# Patient Record
Sex: Female | Born: 1955 | Race: White | Hispanic: No | Smoking: Never smoker
Health system: Southern US, Community
[De-identification: ages and names within clinical notes are randomized; demographics above are authoritative.]

## PROBLEM LIST (undated history)

## (undated) DIAGNOSIS — I10 Essential (primary) hypertension: Secondary | ICD-10-CM

## (undated) DIAGNOSIS — R519 Headache, unspecified: Secondary | ICD-10-CM

## (undated) DIAGNOSIS — F419 Anxiety disorder, unspecified: Secondary | ICD-10-CM

## (undated) DIAGNOSIS — K449 Diaphragmatic hernia without obstruction or gangrene: Secondary | ICD-10-CM

## (undated) DIAGNOSIS — R51 Headache: Secondary | ICD-10-CM

## (undated) DIAGNOSIS — G8929 Other chronic pain: Secondary | ICD-10-CM

## (undated) DIAGNOSIS — F988 Other specified behavioral and emotional disorders with onset usually occurring in childhood and adolescence: Secondary | ICD-10-CM

## (undated) DIAGNOSIS — E785 Hyperlipidemia, unspecified: Secondary | ICD-10-CM

## (undated) HISTORY — PX: APPENDECTOMY: SHX54

## (undated) HISTORY — DX: Diaphragmatic hernia without obstruction or gangrene: K44.9

## (undated) HISTORY — PX: ABDOMINAL HYSTERECTOMY: SHX81

## (undated) HISTORY — DX: Essential (primary) hypertension: I10

## (undated) HISTORY — PX: TUBAL LIGATION: SHX77

## (undated) HISTORY — PX: OTHER SURGICAL HISTORY: SHX169

## (undated) HISTORY — DX: Headache: R51

## (undated) HISTORY — PX: CHOLECYSTECTOMY: SHX55

## (undated) HISTORY — DX: Hyperlipidemia, unspecified: E78.5

## (undated) HISTORY — DX: Other chronic pain: G89.29

## (undated) HISTORY — DX: Other specified behavioral and emotional disorders with onset usually occurring in childhood and adolescence: F98.8

## (undated) HISTORY — DX: Anxiety disorder, unspecified: F41.9

## (undated) HISTORY — DX: Headache, unspecified: R51.9

---

## 1997-10-15 ENCOUNTER — Ambulatory Visit (HOSPITAL_COMMUNITY): Admission: RE | Admit: 1997-10-15 | Discharge: 1997-10-15 | Payer: Self-pay | Admitting: Obstetrics and Gynecology

## 1998-03-16 ENCOUNTER — Encounter: Payer: Self-pay | Admitting: Obstetrics and Gynecology

## 1998-03-21 ENCOUNTER — Inpatient Hospital Stay (HOSPITAL_COMMUNITY): Admission: RE | Admit: 1998-03-21 | Discharge: 1998-03-24 | Payer: Self-pay | Admitting: Obstetrics and Gynecology

## 1999-09-05 ENCOUNTER — Emergency Department (HOSPITAL_COMMUNITY): Admission: EM | Admit: 1999-09-05 | Discharge: 1999-09-05 | Payer: Self-pay | Admitting: Emergency Medicine

## 1999-11-16 ENCOUNTER — Ambulatory Visit (HOSPITAL_COMMUNITY): Admission: RE | Admit: 1999-11-16 | Discharge: 1999-11-16 | Payer: Self-pay | Admitting: Obstetrics and Gynecology

## 1999-11-16 ENCOUNTER — Encounter: Payer: Self-pay | Admitting: Obstetrics and Gynecology

## 1999-11-24 ENCOUNTER — Encounter: Admission: RE | Admit: 1999-11-24 | Discharge: 1999-11-24 | Payer: Self-pay | Admitting: Family Medicine

## 1999-11-24 ENCOUNTER — Encounter: Payer: Self-pay | Admitting: Family Medicine

## 2001-03-04 ENCOUNTER — Ambulatory Visit (HOSPITAL_COMMUNITY): Admission: RE | Admit: 2001-03-04 | Discharge: 2001-03-04 | Payer: Self-pay | Admitting: Gastroenterology

## 2001-12-19 ENCOUNTER — Encounter: Payer: Self-pay | Admitting: Family Medicine

## 2001-12-19 ENCOUNTER — Encounter: Admission: RE | Admit: 2001-12-19 | Discharge: 2001-12-19 | Payer: Self-pay | Admitting: Family Medicine

## 2003-07-16 ENCOUNTER — Ambulatory Visit (HOSPITAL_COMMUNITY): Admission: RE | Admit: 2003-07-16 | Discharge: 2003-07-16 | Payer: Self-pay | Admitting: Cardiovascular Disease

## 2003-11-13 ENCOUNTER — Emergency Department (HOSPITAL_COMMUNITY): Admission: EM | Admit: 2003-11-13 | Discharge: 2003-11-13 | Payer: Self-pay | Admitting: Emergency Medicine

## 2003-11-30 ENCOUNTER — Ambulatory Visit (HOSPITAL_COMMUNITY): Admission: RE | Admit: 2003-11-30 | Discharge: 2003-11-30 | Payer: Self-pay | Admitting: Gastroenterology

## 2005-01-23 ENCOUNTER — Other Ambulatory Visit: Admission: RE | Admit: 2005-01-23 | Discharge: 2005-01-23 | Payer: Self-pay | Admitting: *Deleted

## 2005-02-16 ENCOUNTER — Encounter: Admission: RE | Admit: 2005-02-16 | Discharge: 2005-02-16 | Payer: Self-pay | Admitting: Family Medicine

## 2005-02-28 ENCOUNTER — Encounter: Admission: RE | Admit: 2005-02-28 | Discharge: 2005-02-28 | Payer: Self-pay | Admitting: Family Medicine

## 2006-02-18 ENCOUNTER — Encounter: Admission: RE | Admit: 2006-02-18 | Discharge: 2006-02-18 | Payer: Self-pay | Admitting: Family Medicine

## 2006-08-19 ENCOUNTER — Ambulatory Visit (HOSPITAL_COMMUNITY): Admission: RE | Admit: 2006-08-19 | Discharge: 2006-08-19 | Payer: Self-pay | Admitting: Gastroenterology

## 2006-08-20 ENCOUNTER — Encounter (INDEPENDENT_AMBULATORY_CARE_PROVIDER_SITE_OTHER): Payer: Self-pay | Admitting: *Deleted

## 2007-01-01 ENCOUNTER — Emergency Department (HOSPITAL_COMMUNITY): Admission: EM | Admit: 2007-01-01 | Discharge: 2007-01-01 | Payer: Self-pay | Admitting: Emergency Medicine

## 2007-01-16 ENCOUNTER — Observation Stay (HOSPITAL_COMMUNITY): Admission: AD | Admit: 2007-01-16 | Discharge: 2007-01-17 | Payer: Self-pay | Admitting: Cardiology

## 2008-10-12 ENCOUNTER — Inpatient Hospital Stay (HOSPITAL_COMMUNITY): Admission: EM | Admit: 2008-10-12 | Discharge: 2008-10-15 | Payer: Self-pay | Admitting: Emergency Medicine

## 2008-10-12 ENCOUNTER — Ambulatory Visit: Payer: Self-pay | Admitting: Internal Medicine

## 2008-10-13 ENCOUNTER — Ambulatory Visit: Payer: Self-pay | Admitting: Vascular Surgery

## 2008-10-13 ENCOUNTER — Encounter (INDEPENDENT_AMBULATORY_CARE_PROVIDER_SITE_OTHER): Payer: Self-pay | Admitting: Internal Medicine

## 2009-12-08 ENCOUNTER — Encounter: Admission: RE | Admit: 2009-12-08 | Discharge: 2009-12-08 | Payer: Self-pay

## 2010-05-21 ENCOUNTER — Encounter: Payer: Self-pay | Admitting: Family Medicine

## 2010-05-21 ENCOUNTER — Encounter: Payer: Self-pay | Admitting: Obstetrics and Gynecology

## 2010-05-22 ENCOUNTER — Encounter: Payer: Self-pay | Admitting: Family Medicine

## 2010-08-07 LAB — DIFFERENTIAL
Basophils Absolute: 0 10*3/uL (ref 0.0–0.1)
Basophils Relative: 1 % (ref 0–1)
Eosinophils Absolute: 0.1 10*3/uL (ref 0.0–0.7)
Eosinophils Relative: 1 % (ref 0–5)
Monocytes Absolute: 0.3 10*3/uL (ref 0.1–1.0)
Monocytes Relative: 5 % (ref 3–12)

## 2010-08-07 LAB — CBC
HCT: 38.4 % (ref 36.0–46.0)
HCT: 40.1 % (ref 36.0–46.0)
Hemoglobin: 13.2 g/dL (ref 12.0–15.0)
Hemoglobin: 13.7 g/dL (ref 12.0–15.0)
MCHC: 34.2 g/dL (ref 30.0–36.0)
MCV: 87.4 fL (ref 78.0–100.0)
Platelets: 187 10*3/uL (ref 150–400)
Platelets: 229 10*3/uL (ref 150–400)
RBC: 4.4 MIL/uL (ref 3.87–5.11)
RDW: 13 % (ref 11.5–15.5)
RDW: 13.2 % (ref 11.5–15.5)
WBC: 5.3 10*3/uL (ref 4.0–10.5)
WBC: 6.8 10*3/uL (ref 4.0–10.5)

## 2010-08-07 LAB — CARDIAC PANEL(CRET KIN+CKTOT+MB+TROPI)
CK, MB: 0.4 ng/mL (ref 0.3–4.0)
CK, MB: 0.5 ng/mL (ref 0.3–4.0)
CK, MB: 0.6 ng/mL (ref 0.3–4.0)
Relative Index: INVALID (ref 0.0–2.5)
Relative Index: INVALID (ref 0.0–2.5)
Total CK: 30 U/L (ref 7–177)
Total CK: 43 U/L (ref 7–177)
Troponin I: 0.01 ng/mL (ref 0.00–0.06)
Troponin I: 0.01 ng/mL (ref 0.00–0.06)
Troponin I: <0.01 ng/mL (ref 0.00–0.06)

## 2010-08-07 LAB — TROPONIN I
Troponin I: 0.02 ng/mL (ref 0.00–0.06)
Troponin I: 0.11 ng/mL — ABNORMAL HIGH (ref 0.00–0.06)
Troponin I: 0.01 ng/mL (ref 0.00–0.06)

## 2010-08-07 LAB — LIPID PANEL
Cholesterol: 196 mg/dL (ref 0–200)
HDL: 36 mg/dL — ABNORMAL LOW (ref >39)
LDL Cholesterol: 139 mg/dL — ABNORMAL HIGH (ref 0–99)
Total CHOL/HDL Ratio: 5.4 RATIO
Triglycerides: 104 mg/dL (ref <150)
VLDL: 21 mg/dL (ref 0–40)

## 2010-08-07 LAB — BASIC METABOLIC PANEL
BUN: 14 mg/dL (ref 6–23)
CO2: 29 mEq/L (ref 19–32)
Calcium: 8.8 mg/dL (ref 8.4–10.5)
Chloride: 108 mEq/L (ref 96–112)
Creatinine, Ser: 0.6 mg/dL (ref 0.4–1.2)
GFR calc Af Amer: >60 mL/min (ref >60)
GFR calc non Af Amer: >60 mL/min (ref >60)
Glucose, Bld: 97 mg/dL (ref 70–99)
Potassium: 3.7 mEq/L (ref 3.5–5.1)
Sodium: 141 mEq/L (ref 135–145)

## 2010-08-07 LAB — CK TOTAL AND CKMB (NOT AT ARMC)
CK, MB: 0.5 ng/mL (ref 0.3–4.0)
CK, MB: 4.3 ng/mL — ABNORMAL HIGH (ref 0.3–4.0)
CK, MB: 0.6 ng/mL (ref 0.3–4.0)
Relative Index: 4.1 — ABNORMAL HIGH (ref 0.0–2.5)
Relative Index: INVALID (ref 0.0–2.5)
Total CK: 106 U/L (ref 7–177)
Total CK: 42 U/L (ref 7–177)
Total CK: 45 U/L (ref 7–177)

## 2010-08-07 LAB — POCT CARDIAC MARKERS
CKMB, poc: <1 ng/mL — ABNORMAL LOW (ref 1.0–8.0)
Troponin i, poc: <0.05 ng/mL (ref 0.00–0.09)

## 2010-08-07 LAB — TSH: TSH: 3.413 u[IU]/mL (ref 0.350–4.500)

## 2010-08-07 LAB — CORTISOL: Cortisol, Plasma: 4.5 ug/dL

## 2010-08-07 LAB — COMPREHENSIVE METABOLIC PANEL
ALT: 17 U/L (ref 0–35)
Albumin: 3.7 g/dL (ref 3.5–5.2)
Alkaline Phosphatase: 79 U/L (ref 39–117)
Chloride: 107 mEq/L (ref 96–112)
Potassium: 3.9 mEq/L (ref 3.5–5.1)
Sodium: 141 mEq/L (ref 135–145)
Total Bilirubin: 0.5 mg/dL (ref 0.3–1.2)
Total Protein: 6.7 g/dL (ref 6.0–8.3)

## 2010-08-07 LAB — APTT: aPTT: 26 seconds (ref 24–37)

## 2010-08-07 LAB — D-DIMER, QUANTITATIVE: D-Dimer, Quant: <0.22 ug/mL-FEU (ref 0.00–0.48)

## 2010-09-12 NOTE — Cardiovascular Report (Signed)
Brandy [name], Brandy [name]               ACCOUNT NO.:  [id number]   MEDICAL RECORD NO.:  [id number]          PATIENT TYPE:  INP   LOCATION:  3730                         FACILITY:  [facility]   PHYSICIAN:  Brandy Bathe, MD      DATE OF BIRTH:  08-Jan-1956   DATE OF PROCEDURE:  01/17/2007  DATE OF DISCHARGE:                            CARDIAC CATHETERIZATION   PROCEDURES:  1. Left heart catheterization.  2. Left ventriculography.  3. Selective coronary angiography.   PRIMARY OPERATOR:  Brandy [name], M.D.   INDICATIONS:  Brandy [name] is a 55 year old female with hypertension,  recent onset tobacco use, obesity who was seen in consultation  originally on January 08, 2007, who then returned to clinic with  symptoms of diaphoresis and chest pressure following exertion concerning  for unstable angina.  Cardiac biomarkers overnight were normal.  ECG  unremarkable.  She is here for left heart catheterization.   PROCEDURE:  The patient was consented.  Risks and benefits explained.  She was prepped in a sterile fashion, placed on the catheterization  table.  Utilizing 1% lidocaine, the right groin was infiltrated for  local anesthesia.  She was given 1 mg of Versed 25 mcg of fentanyl for  conscious sedation.  Using the modified Seldinger technique, a 5-French  sheath was placed in the right femoral artery.  Under fluoroscopic  guidance and utilizing the over-the-wire technique, a JL4, 5-French  catheter was selectively cannulated into the left main artery.  Hand  injections with Omnipaque were then used.  Multiple views were taken of  the left coronary artery system.  This catheter was then exchanged for  the JR4, 5-French catheter over the wire under fluoroscopic guidance.  This was selectively cannulated into the right coronary artery where  multiple views were taken with hand injection.  This catheter was  exchanged for the angled pigtail, which crossed the aortic valve into  the left  ventricle.  Hemodynamics were obtained.  In the RAO position  and with power injection, 25 mL at 12 per second was utilized for a  ventriculogram.  Pullback across the aortic valve was then obtained.  Following the procedure, ACT was drawn and sheath was removed, manual  pressure held.  The patient tolerated this well.   FINDINGS:  1. Left Main:  Long artery, no significant coronary artery disease.  2. Left Anterior Descending Artery:  There are no significant diagonal      branches.  No significant coronary artery disease.  3. Ramus:  Traverses laterally to the left anterior descending artery.      This is a branching vessel.  There is no significant coronary      artery disease.  4. Left Circumflex Artery:  There is 1 distal branching obtuse      marginal which is large in caliber.  There is no significant      coronary artery disease.  5. Right Coronary Artery:  This is a dominant vessel.  No significant      coronary artery disease.  6. Left Ventriculogram:  Demonstrates normal left ventricular ejection  fraction estimated at 60%.   HEMODYNAMICS:  Left ventricle:  116 systolic pressure.  LV EDP was 2.  Aortic pressure 116/68 with a mean of 69.  No hemodynamic gradient  across the aortic valve.  There were no regional wall motion  abnormalities within the left ventricle.   IMPRESSION:  1. No angiographically significant coronary artery disease.  2. Normal left ventricular ejection fraction estimated at 60% with no      regional wall motion abnormalities.   RECOMMENDATIONS:  Aggressive risk factor modification including tobacco  cessation, hypertension control, obesity weight loss and control of  anxiety.  As a note, her husband recently died of a stroke in 10-05-22 and  she is battling with significant anxiety surrounding this event.  The  patient tolerated the procedure well.  Findings discussed with the  patient and her family.      Brandy Bathe, MD  Electronically  Signed     MCS/MEDQ  D:  01/17/2007  T:  01/17/2007  Job:  [id number]   cc:   Brandy [name]. Brandy [name], M.D.

## 2010-09-12 NOTE — H&P (Signed)
Brandy [name]               ACCOUNT NO.:  [id number]   MEDICAL RECORD NO.:  [id number]          PATIENT TYPE:  INP   LOCATION:  3730                         FACILITY:  [facility]   PHYSICIAN:  Brandy Bathe, Brandy [name]      DATE OF BIRTH:  1955-11-29   DATE OF ADMISSION:  01/16/2007  DATE OF DISCHARGE:  01/17/2007                              HISTORY & PHYSICAL   PRIMARY CARE PHYSICIAN:  Brandy [name], M.D., [facility].   CHIEF COMPLAINT:  Chest pain, transferred from clinic.   HISTORY OF PRESENT ILLNESS:  Brandy [name] is a 55 year old female with  hypertension, hyperlipidemia, recent onset tobacco use, family history  of coronary artery disease who was seen originally in consultation on  January 08, 2007 then returned to my clinic today for evaluation  following exertional angina.  She was cutting her grass on her riding  lawn mower then went inside and felt diaphoretic and substernal chest  pressure.  Went into the shower and diaphoresis still remained as well  as chest pressure.  This prompted her visit to my office where an EKG  was performed and unchanged from prior.  Given the possibility of  unstable angina, I transferred her to a telemetry bed for further  evaluation and workup.   Originally she was seen at the request of Dr. Abigail [name] for the evaluation  of an abnormal EKG showing poor R wave progression and chest pain.   Her husband recently died secondary to a stroke in Sep 13, 2022 and there is much  stress and anxiety surrounding this event.   PAST MEDICAL HISTORY:  1. Hypertension.  2. Hyperlipidemia.  3. Obesity.  4. Headaches.  5. Abnormal EKG as described above.  6. Depression - possible lithium use in the past, according to sons.   PAST SURGICAL HISTORY:  1. Total abdominal hysterectomy.  2. Tubal ligation.  3. Cholecystectomy, appendectomy.  4. Tympanic membrane repair.   ALLERGIES:  NO KNOWN DRUG ALLERGIES.   MEDICATIONS:  1. Xanax 1 mg nightly.  2.  Diovan/hydrochlorothiazide 160/25 once a day.   FAMILY HISTORY:  Father died of a heart attack at age 4.  Mother has  heart failure and is alive.   SOCIAL HISTORY:  She just started smoking 1 pack a week since her  husband's stroke in 09/13/22.  Denies any alcohol or illicit drug use.  She  works as a Armed forces technical officer at [employer] and has  noted increased stress.   REVIEW OF SYSTEMS:  Denies any syncope, orthopnea, PND, edema, nausea.  Unless specified above, all other 12 review of systems negative.   PHYSICAL EXAMINATION:  VITAL SIGNS:  Blood pressure 130/80, heart rate  97, respirations 18, saturating 100% on room air, temperature 98.3.  GENERAL:  Alert and oriented x3, no acute distress.  Nervous.  EYES:  Well perfused conjunctivae.  EOMI.  Nonicteric sclerae.  HEENT:  Neck is supple.  No thyromegaly.  No lymphadenopathy.  CARDIOVASCULAR:  Regular rate and rhythm with no murmur, rubs or  gallops.  No carotid bruits.  No JVD.  There are  2+ dorsalis pedis  pulses bilateral lower extremities.  LUNGS:  Clear to auscultation bilaterally.  No wheezes.  No rales.  Normal respiratory effort.  ABDOMEN:  Soft, nontender.  Normoactive bowel sounds.  No  hepatosplenomegaly.  EXTREMITIES:  No clubbing, cyanosis or edema.  SKIN:  Warm, dry and intact.  NEUROLOGIC:  Nonfocal.  No tremors.   LABORATORY STUDIES:  ECG obtained at the office demonstrated sinus  tachycardia with possible left atrial enlargement and similar R wave  transition as prior.  This EKG was compared to prior and other than  sinus tachycardia no significant change.   LABORATORY DATA:  First set of cardiac biomarkers normal.  LFTs normal.  Chemistry normal except for mildly elevated glucose of 116.  Creatinine  0.9 and hemoglobin 14.  D-dimer normal.  TSH normal.  Chest x-ray  personally reviewed shows borderline cardiac enlargement with minimal  bronchitic changes.   ASSESSMENT AND PLAN:  This is a  55 year old female with hypertension,  hyperlipidemia, tobacco use with unstable angina.  1. Unstable angina - we will place on telemetry.  Lovenox acute      coronary syndrome dose.  Oxygen, beta blocker as tolerated.      Nitroglycerin p.r.n.  we will plan on cardiac catheterization in      a.m., n.p.o. past midnight.  Continue to cycle enzymes.  2. Hypertension - currently stable on current medications as listed      above.  3. Hyperlipidemia - we will check a fasting lipid profile.  On no      Statin medication currently.  4. Depression - taking Xanax for sleep aid.  Supposedly he had been on      lithium in the past according to her sons.  We will investigate      this further.      Brandy Bathe, Brandy [name]  Electronically Signed     MCS/MEDQ  D:  01/17/2007  T:  01/17/2007  Job:  [id number]   cc:   Brandy [name], M.D.

## 2010-09-12 NOTE — Discharge Summary (Signed)
Brandy [name], Brandy [name]               ACCOUNT NO.:  [id number]   MEDICAL RECORD NO.:  [id number]          PATIENT TYPE:  INP   LOCATION:  3729                         FACILITY:  [facility]   PHYSICIAN:  Lonia Blood, M.D.DATE OF BIRTH:  October 08, 1955   DATE OF ADMISSION:  10/12/2008  DATE OF DISCHARGE:  10/15/2008                               DISCHARGE SUMMARY   PRIMARY CARE PHYSICIAN:  Dr. Abigail Miyamoto.   CARDIOLOGIST:  Dr. Anne Fu.   DISCHARGE DIAGNOSES:  1. Substernal chest pain.      a.     One positive troponin out of four complete cardiac enzyme       assays.      b.     Entirely normal electrocardiogram.      c.     Nuclear medicine stress test normal with no evidence of       myocardial ischemia and ejection fraction of 52%.  2. Transthoracic echocardiogram revealing no evidence of wall motion      abnormalities.      a.     Follow up with Dr. Anne Fu in approximately 2-3 weeks.  3. Questionable syncope versus presyncope.      a.     Normal echocardiogram with exception to very mild diastolic       dysfunction.      b.     Normal metabolic workup, to include random serum cortisol,       TSH, BNP.      c.     Symptoms resolved during hospital stay.  4. Uncontrolled hypertension - Norvasc added to regimen.  5. Attention deficit disorder.  6. Status post cholecystectomy.  7. Status post appendectomy.  8. Status post bilateral tubal ligation with hysterectomy.   DISCHARGE MEDICATIONS:  1. Xanax 2 mg p.o. nightly.  2. Diovan 160 mg p.o. daily.  3. Adderall 20 mg p.o. b.i.d.  4. Norvasc 2.5 mg p.o. daily.  5. Imdur 30 mg p.o. daily.   FOLLOWUP:  1. The patient is advised to follow up with Dr. Abigail Miyamoto in 5-7 days      for a recheck of her blood pressure.  2. The patient is advised to follow up with Dr. Donato Schultz on November 05, 2008, at 3:15 in the afternoon.   PROCEDURES:  1. Transthoracic echocardiogram on October 13, 2008 - LV systolic      function normal, ejection  fraction 60-65%.  No regional motion      abnormalities.  Doppler parameters consistent with abnormal left      ventricular relaxation consistent with grade I diastolic      dysfunction.  2. Nuclear medicine cardiac stress test - no evidence for myocardial      ischemia.  Left ventricular ejection fraction 52%.  Normal left      ventricular systolic function.   HOSPITAL COURSE:  Brandy [name] is a very pleasant 55 year old  female who presented to the hospital on October 12, 2008, with complaints  of chest pain.  She states that this pain radiating into her left arm at  its  occurrence.  It occurred off and on for approximately 7 days.  It  seemed to be worsening in severity and frequency of occurrence.  Additionally, the patient reports an episode during which she feels that  she lost consciousness.  As a result, the patient was admitted to cycle  cardiac enzymes to rule out MI, as well as to evaluate the possibility  of a syncopal spell.  Transthoracic echocardiogram was carried out and  was unrevealing.  Metabolic evaluation for the possible causes of  syncope were carried out and were unrevealing.  Carotid Dopplers were  obtained and revealed no evidence of ICA stenosis.  No clear etiology  for potential syncope could be encountered and no recurrent episodes of  syncope were encountered.  Cardiac enzymes were cycled.  As noted, a  minimum four total sets were obtained.  Only one set revealed an  elevated troponin at 0.11.  This had rapidly corrected by the next.  Ultimately, with normal EKGs and enzymes, a nuclear medicine stress test  was felt to be most appropriate risk stratification method.  This was  carried out on October 15, 2008, and was in fact normal as noted above.   On October 15, 2008, with a normal stress test and a normal syncope  evaluation, the patient was felt to be stable for discharge.  During the  hospital stay, the patient was noted to have some elevated blood   pressure and, therefore, Norvasc was added to her regimen.  She is to  continue the above listed medication regimen after her discharge with  follow up as noted above.      Lonia Blood, M.D.  Electronically Signed     JTM/MEDQ  D:  10/15/2008  T:  10/15/2008  Job:  [id number]   cc:   Chales Salmon. Abigail Miyamoto, M.D.  Jake Bathe, MD

## 2010-09-12 NOTE — H&P (Signed)
[name], [name]               ACCOUNT NO.:  [id number]   MEDICAL RECORD NO.:  [id number]          PATIENT TYPE:  INP   LOCATION:  1830                         FACILITY:  [facility]   PHYSICIAN:  Della Goo, M.D. DATE OF BIRTH:  18-Jul-1955   DATE OF ADMISSION:  10/12/2008  DATE OF DISCHARGE:                              HISTORY & PHYSICAL   PRIMARY CARE PHYSICIAN:  Unassigned.   CHIEF COMPLAINT:  Chest pain.   HISTORY OF PRESENT ILLNESS:  This is a 55 year old female who presents  to the emergency department via EMS with complaints of severe chest pain  which is substernal, radiating into the left arm which has been  occurring off and on for the past few weeks and she reports has been  worsening over the past few days.  She states today at about 12:30 p.m.  the pain was so severe and she had associated symptoms of shortness of  breath and diaphoresis.  She also reports having an episode of possibly  passing out.  She states when she came around, she ended up calling 9-1-  1.  The patient states that since she had made an appointment with a  cardiologist in [place] and was to see him the next day.  The patient  rated her pain intensity as being a 7/10 to 8/10 at the worst.  She  states that she has been having chest pain off and on for few weeks.   PAST MEDICAL HISTORY:  Significant for hypertension and attention  deficit disorder.   PAST SURGICAL HISTORY:  History of a cholecystectomy, appendectomy,  bilateral tubal ligation and hysterectomy.   MEDICATIONS:  1. Include Xanax 2 mg p.o. q.h.s.  2. Diovan 160 mg 1 p.o. daily.  3. Adderall 20 mg p.o. b.i.d.   ALLERGIES:  No known drug allergies.   SOCIAL HISTORY:  The patient reports she is a nonsmoker, nondrinker and  no history of illicit drug usage.   FAMILY HISTORY:  Positive for coronary artery disease in both parents.  Her father died of MI at age 78.  Mother died at age 45.  Positive for  hypertension in 2  brothers.  No diabetes in the family.  Positive for  cancer in both maternal grandparents, unknown types.  Her maternal  grandmother also had a CVA and her paternal aunt had breast cancer.   REVIEW OF SYSTEMS:  Positives mentioned above.   PHYSICAL EXAMINATION:  CONSTITUTIONAL:  This is a 55 year old overweight  female in no visible discomfort or acute distress currently.  VITAL SIGNS:  Temperature 97.4, blood pressure 142/78, heart rate 83,  respirations 14, O2 saturations 100% on room air.  HEENT:  Examination normocephalic, atraumatic.  Pupils equally round  reactive to light.  Extraocular movements are intact.  Funduscopic  benign oropharynx is clear.  NECK:  Supple full range of motion.  No thyromegaly, adenopathy,  jugulovenous distention.  CARDIOVASCULAR:  Regular rate and rhythm.  No murmurs, gallops or rubs.  LUNGS:  Clear to auscultation bilaterally.  ABDOMEN:  Positive bowel sounds, soft, nontender, nondistended.  EXTREMITIES:  Without cyanosis, clubbing or  edema.  NEUROLOGIC:  The patient is alert and oriented x3.  There are no focal  deficits.  Cranial nerves are intact.  Motor and sensory function also  intact.   LABORATORY STUDIES:  White blood cell count 6.8, hemoglobin 13.7,  hematocrit 40.1, MCV 87.5, platelets 229,000, neutrophils 56%,  lymphocytes 38%.  Pro time 12.9, INR 1.0, PTT 26.  Sodium 141, potassium  3.9, chloride 107, carbon dioxide 27, BUN 19, creatinine 0.79 and  glucose 73.  Albumin 3.7, AST 17, ALT 17.  Chest x-ray reveals no acute  disease process.  EKG revealed normal sinus rhythm without acute ST-  segment changes and rate of 76.   ASSESSMENT:  73. A 55 year old female being admitted with chest pain.  2. Hypertension.  3. History of attention deficit disorder.   PLAN:  The patient will be admitted to telemetry area for cardiac  monitoring.  Cardiac enzymes will be performed.  The patient will be  placed on Nitro paste, oxygen and aspirin  therapy.  DVT and GI  prophylaxis will be ordered.  Fasting lipids will also be ordered in the  a.m.  Further workup will ensue pending results of the patient's studies  and a decision will be made whether the patient will have outpatient  stress testing versus inpatient versus other cardiac evaluation options.      Della Goo, M.D.  Electronically Signed     HJ/MEDQ  D:  10/12/2008  T:  10/12/2008  Job:  [id number]

## 2010-09-15 NOTE — Procedures (Signed)
[facility]  Patient:    Brandy [name], Brandy [name] Visit Number: [id number] MRN: [id number]          Service Type: END Location: ENDO Attending Physician:  Louie Bun Proc. Date: 03/04/01 Admit Date:  03/04/2001   CC:         Miguel Aschoff, M.D.   Procedure Report  PROCEDURE:  Esophagogastroduodenoscopy with biopsy.  INDICATION FOR PROCEDURE:  Persistent nausea despite several months of Nexium.  DESCRIPTION OF PROCEDURE:  The patient was placed in the left lateral decubitus position and placed on the pulse monitor with continuous low-flow oxygen delivered by nasal cannula.  She was sedated with 60 mg IV Demerol and 6 mg IV Versed.  The Olympus video endoscope was advanced under direct vision into the oropharynx and esophagus.  The esophagus was straight and of normal caliber with the squamocolumnar line at 37 cm.  The GE junction appeared somewhat patulous, and the squamocolumnar line was sawtoothed with at least one discrete erosion extending about 2 cm upward from the main level of the Z-line with Brandy exudate.  This was consistent with grade 2 erosive esophagitis. There was Brandy obvious ring or stricture.  There may have been a [name] sliding hiatal hernia.  The stomach was entered, and a [name] amount of liquid secretions were suctioned from the fundus.  Retroflexed view of the cardia was unremarkable.  The fundus, body, antrum, and pylorus all appeared normal.  The duodenum was entered, and both the bulb and second portion were well-inspected and appeared to be within normal limits.  The scope was withdrawn back into the stomach and a CLOtest obtained.  The scope was then withdrawn, and the patient returned to the recovery room in stable condition.  She tolerated the procedure well, and there were Brandy immediate complications.  IMPRESSION:  Esophagitis.  PLAN:  Await CLOtest and consider adding Reglan if Brandy evidence of Helicobacter. Attending  Physician:  Louie Bun DD:  03/04/01 TD:  03/05/01 Job: [id number]

## 2010-09-15 NOTE — H&P (Signed)
NAMEELLANA, KAWA SMALL                     ACCOUNT NO.:  [id number]   MEDICAL RECORD NO.:  [id number]                   PATIENT TYPE:  OIB   LOCATION:                                       FACILITY:  [facility]   PHYSICIAN:  Vesta Mixer, M.D.              DATE OF BIRTH:  10-17-1955   DATE OF ADMISSION:  07/16/2003  DATE OF DISCHARGE:                                HISTORY & PHYSICAL   Ms. Tippins is a 55 year old female with a history of chest pain. She also  has a history of hypertension, hypercholesterolemia, and gastroesophageal  reflux.  She was recently seen for an episode of chest pain and a  subsequently stress Cardiolite study was abnormal.  She was found to have  evidence of anterior wall ischemia and a mildly depressed left ventricular  systolic function. She was referred for heart catheterization for further  evaluation.  The patient has continued to have intermittent episodes of  chest pain.  These are especially severe when she tries to walk anywhere.  She is still able to do her work but only at a very leisurely pace.  She has  not had any episodes of syncope or presyncope.   CURRENT MEDICATIONS:  1. Premarin once a day.  2. Verapamil 240 mg twice a day.  3. Nexium 40 mg a day.  4. Lexapro once a day.   ALLERGIES:  No known drug allergies.   PAST MEDICAL HISTORY:  1. Hypertension.  2. Hypercholesterolemia.  3. Gastroesophageal reflux disease.   SOCIAL HISTORY:  The patient works at [employer] on [address] [employer] [employer].  She does not smoke.  She does not drink alcohol.  She  walks or rides an exercise bike occasionally.   FAMILY HISTORY:  Father died at age 31 due to a myocardial infarction.  Her  mother is 37 years old and has a history of congestive heart failure.   REVIEW OF SYMPTOMS:  This was reviewed and is essentially negative.   PHYSICAL EXAMINATION:  GENERAL APPEARANCE:  She is a young female in no  acute distress.  She was alert and  oriented x3 and mood and affect are  normal.  VITAL SIGNS:  Weight is 231, blood pressure 170/110 with heart rate of 72.  HEENT:  There are 2+ carotids.  She had no bruits.  There is no JVD, no  thyromegaly.  LUNGS:  Clear to auscultation.  CARDIOVASCULAR:  Regular rate, S1 and S2 with no murmurs, rubs, or gallops.  ABDOMEN:  Good bowel sounds and is nontender.  EXTREMITIES:  She has no clubbing, cyanosis, or edema.  NEUROLOGIC:  Nonfocal.   Her stress Cardiolite study revealed the presence of anterior apical  ischemia.  I cannot completely rule out breast attenuation, although the  defect does appear to be reversible.  She also has hypokinesis of the  anterior wall with an ejection fraction of 47%.  Ms. Nakama presents with an abnormal Cardiolite scan.  For further  evaluation, I have recommended that we proceed with heart catheterization.  We discussed the risks, benefits, and options regarding heart  catheterization.  She understands and agrees to proceed.                                                Vesta Mixer, M.D.    PJN/MEDQ  D:  07/13/2003  T:  07/15/2003  Job:  [id number]   cc:   Miguel Aschoff, M.D.  [address], [address]  [place]  [place] [address]  Fax: [phone]

## 2010-09-15 NOTE — Op Note (Signed)
Brandy [name], Brandy [name]               ACCOUNT NO.:  [id number]   MEDICAL RECORD NO.:  [id number]          PATIENT TYPE:  AMB   LOCATION:  ENDO                         FACILITY:  [facility]   PHYSICIAN:  Anselmo Rod, M.D.  DATE OF BIRTH:  July 15, 1955   DATE OF PROCEDURE:  08/19/2006  DATE OF DISCHARGE:                               OPERATIVE REPORT   PROCEDURE PERFORMED:  Colonoscopy with multiple cold biopsies.   ENDOSCOPIST:  Anselmo Rod, M.D.   INSTRUMENT USED:  Pentax video colonoscope.   INDICATIONS:  A 55 year old white female with a history of change in  bowel habits, worsening diarrhea and lower abdominal cramping undergoing  screening colonoscopy.  The patient has a brother who has had colon  cancer.  Rule out colonic polyps, masses, etc.   PREPROCEDURE PREPARATION:  Informed consent was procured from the  patient.  The patient fasted for four hours prior to procedure and  prepped with 20 Osmoprep pills the night of and 12 Osmoprep pills the  morning of the procedure.  The risks and benefits of the procedure  including a 10% miss rate of cancer and polyps were discussed with the  patient as well.   PREPROCEDURE PHYSICAL:  The patient had stable vital signs.  Neck  supple.  Chest clear to auscultation.  S1, S2 regular.  Abdomen soft  with normal bowel sounds.   DESCRIPTION OF PROCEDURE:  The patient was placed in the left lateral  decubitus position and sedated with 125 mcg of fentanyl and 12.5 mg of  Versed was given intravenously in slow incremental doses.  Once the  patient was adequately sedated and maintained on low-flow oxygen and  continuous cardiac monitoring, the Pentax video colonoscope was advanced  from the rectum to the cecum.  The appendiceal orifice and ileocecal  valve were clearly visualized and photographed.  Random colon biopsies  were done to rule out collagenous colitis.  No masses or polyps were  seen.  The patient tolerated the procedure well  without immediate  complications.   IMPRESSION:  1. Normal colonoscopy of the terminal ileum.  No masses, polyps or      diverticula seen.  2. Random colon biopsies done to rule out collagenous colitis.   RECOMMENDATIONS:  1. Await pathology results.  2. Avoid all nonsteroidals for now.  3. Trial of cholestyramine if the diarrhea persists.  4. Outpatient follow-up in the next two weeks for further      recommendations.      Anselmo Rod, M.D.  Electronically Signed     JNM/MEDQ  D:  08/20/2006  T:  08/21/2006  Job:  [id number]   cc:   C. Duane Lope, M.D.

## 2010-09-15 NOTE — Cardiovascular Report (Signed)
Brandy [name], Brandy [name]                     ACCOUNT NO.:  [id number]   MEDICAL RECORD NO.:  [id number]                   PATIENT TYPE:  OIB   LOCATION:  2853                                 FACILITY:  [facility]   PHYSICIAN:  Vesta Mixer, M.D.              DATE OF BIRTH:  08/18/1955   DATE OF PROCEDURE:  07/16/2003  DATE OF DISCHARGE:  07/16/2003                              CARDIAC CATHETERIZATION   Brandy [name] is a 55 year old female with recent episodes of chest pain.  She  had an abnormal stress Cardiolite study which revealed attenuation of the  anterior wall.  It was thought it was due to either breast artifact or  perhaps ischemia.  She continued to have exertional episodes of chest pain  and was referred for heart catheterization.   The right femoral artery was easily cannulated using a modified Seldinger  technique.   HEMODYNAMICS:  The left ventricular pressure was 115/9 with an aortic  pressure of 114/66.   CORONARY ANGIOGRAPHY:  1. Left main coronary artery is smooth and normal.  2. The left anterior descending artery is moderate size branch.  It gives     off a moderate size diagonal vessel and normal.  There are no discrete     stenoses.  The LAD reaches around the apex and supplies the apical wall.  3. The left circumflex artery is a large vessel and was fairly tortuous.  It     provides a very large posterior lateral branch and no significant obtuse     marginal branches.  4. The ramus intermediate branch is a [name] to moderate size branch.  It     follows the path of obtuse marginal distribution.  It is relatively     smooth and normal.  5. The right coronary artery is large and dominant.  It is smooth and normal     throughout its course.  The posterior descending artery and the posterior     lateral segment artery are unremarkable.   LEFT VENTRICULOGRAM:  Left ventriculogram was performed at a 30-RAO  position.  It reveals a mildly dilated left ventricle.   The ejection  fraction is around 50%.  There are no segmental wall motion abnormalities.   COMPLICATIONS:  None.   CONCLUSIONS:  1. Smooth and normal coronary arteries.  2. The left ventricular systolic function is at the lower limits of normal.     The ejection fraction was 47% by Cardiolite scan and approximately 50%     here.  We will need to continue with medical therapy for her     hypertension.                                               Vesta Mixer, M.D.    PJN/MEDQ  D:  07/16/2003  T:  07/17/2003  Job:  [id number]   cc:   C. Duane Lope, M.D.  [address]  [place]  [place] [address]  Fax: [phone]

## 2010-11-06 ENCOUNTER — Ambulatory Visit (INDEPENDENT_AMBULATORY_CARE_PROVIDER_SITE_OTHER): Payer: BC Managed Care – PPO | Admitting: Gastroenterology

## 2010-11-06 ENCOUNTER — Encounter: Payer: Self-pay | Admitting: Gastroenterology

## 2010-11-06 VITALS — BP 120/88 | HR 84 | Ht 66.0 in | Wt 192.2 lb

## 2010-11-06 DIAGNOSIS — K219 Gastro-esophageal reflux disease without esophagitis: Secondary | ICD-10-CM

## 2010-11-06 DIAGNOSIS — F411 Generalized anxiety disorder: Secondary | ICD-10-CM

## 2010-11-06 DIAGNOSIS — R197 Diarrhea, unspecified: Secondary | ICD-10-CM

## 2010-11-06 MED ORDER — GLYCOPYRROLATE 2 MG PO TABS: 2.0000 mg | ORAL_TABLET | Freq: Two times a day (BID) | ORAL | Status: DC

## 2010-11-06 MED ORDER — PANTOPRAZOLE SODIUM 40 MG PO TBEC: 40.0000 mg | DELAYED_RELEASE_TABLET | Freq: Every day | ORAL | Status: DC

## 2010-11-06 NOTE — Patient Instructions (Addendum)
Pick up your prescriptions from your pharmacy.  Follow instructions on Hemoccult cards and mail back to [place] when finished.  Patient advised to avoid spicy, acidic, citrus, chocolate, mints, fruit and fruit juices.  Limit the intake of caffeine, alcohol and Soda.  Don't exercise too soon after eating.  Don't lie down within 3-4 hours of eating.  Elevate the head of your bed. Call [phone] to register for Bariatric Seminar.  cc: Aida Puffer, MD

## 2010-11-06 NOTE — Progress Notes (Signed)
History of Present Illness: This is a 55 year old female complaining of postprandial nausea and substernal chest burning. She also complains of alternating diarrhea and constipation associated with generalized abdominal discomfort for many years. She was previously evaluated by Dr. Dorena Cookey in 2002 and underwent upper endoscopy which revealed grade 2 erosive esophagitis upper GI series in 2001 revealed a small hiatal hernia. Gastric emptying scan in 2005 revealed 61% retention at 2 hours. She was then evaluated by Dr. Loreta Ave and underwent colonoscopy in April 2008 for change in bowel habits with worsening diarrhea and lower abdominal pain. Her colonoscopy was normal to the terminal ileum and random biopsies were obtained. She was advised to discontinue nonsteroidals and try cholestyramine for persistent diarrhea. Patient was recently evaluated by Dr. Aida Puffer who started omeprazole however the patient states that capsules worsen her reflux symptoms so she discontinued the medication. She states she is very anxious about her gastrointestinal complaints and other health-related issues. She denies weight loss, vomiting, melena, hematochezia, change in stool caliber. CMET, CBC and TSH from May 2012 were all normal.  Past Medical History  Diagnosis Date  . Hypertension   . ADD (attention deficit disorder)   . Esophagitis     erosive  . Hiatal hernia   . Anxiety   . Chronic headaches   . Hyperlipemia    Past Surgical History  Procedure Date  . Tubal ligation   . Cholecystectomy   . Appendectomy   . Abdominal hysterectomy   . Goiter removal     reports that she has never smoked. She has never used smokeless tobacco. She reports that she does not drink alcohol or use illicit drugs. family history includes Breast cancer in her paternal aunt; Colon cancer in her brother; Coronary artery disease in her father and mother; Heart attack (age of onset:57) in her father; and Hypertension in her  brother. Allergies  Allergen Reactions  . Codeine Itching   Outpatient Encounter Prescriptions as of 11/06/2010  Medication Sig Dispense Refill  . alprazolam (XANAX) 2 MG tablet Take 2 mg by mouth at bedtime as needed.        Marland Kitchen amphetamine-dextroamphetamine (ADDERALL) 20 MG tablet Take 20 mg by mouth 2 (two) times daily.        Marland Kitchen LISINOPRIL PO Take 12.5 mg by mouth daily.        . vitamin B-12 (CYANOCOBALAMIN) 100 MCG tablet Take 50 mcg by mouth daily.        Marland Kitchen glycopyrrolate (ROBINUL-FORTE) 2 MG tablet Take 1 tablet (2 mg total) by mouth 2 (two) times daily.  60 tablet  11  . pantoprazole (PROTONIX) 40 MG tablet Take 1 tablet (40 mg total) by mouth daily.  30 tablet  11  . DISCONTD: amLODipine (NORVASC) 2.5 MG tablet Take 2.5 mg by mouth daily.        Marland Kitchen DISCONTD: isosorbide mononitrate (IMDUR) 30 MG 24 hr tablet Take 30 mg by mouth daily.        Marland Kitchen DISCONTD: valsartan (DIOVAN) 160 MG tablet Take 160 mg by mouth daily.         Review of Systems: Allergies, anxiety, depression, sleeping problems, increased urination. Pertinent positive and negative review of systems were noted in the above HPI section. All other review of systems were otherwise negative.  Physical Exam: General: Well developed , well nourished, no acute distress Head: Normocephalic and atraumatic Eyes:  sclerae anicteric, EOMI Ears: Normal auditory acuity Mouth: No deformity or lesions Neck: Supple, no masses  or thyromegaly Lungs: Clear throughout to auscultation Heart: Regular rate and rhythm; no murmurs, rubs or bruits Abdomen: Soft, non tender and non distended. No masses, hepatosplenomegaly or hernias noted. Normal Bowel sounds Musculoskeletal: Symmetrical with no gross deformities  Skin: No lesions on visible extremities Pulses:  Normal pulses noted Extremities: No clubbing, cyanosis, edema or deformities noted Neurological: Alert oriented x 4, grossly nonfocal Cervical Nodes:  No significant cervical  adenopathy Inguinal Nodes: No significant inguinal adenopathy Psychological:  Alert and cooperative. Normal mood and affect  Assessment and Recommendations:  1. Presumed GERD. Prior history of erosive esophagitis. Prior delayed gastric emptying. Discontinue omeprazole capsules per her request. Begin pantoprazole 40 mg tablets 1 by mouth every morning and standard antireflux measures. If her symptoms do not respond we'll proceed with upper endoscopy for further evaluation.  2. Presumed irritable bowel syndrome. Colonoscopy to the terminal ileum including random biopsies was negative in April 2008. Begin a high fiber diet increased water intake and glycopyrrolate 2 mg twice daily. Consider adding a probiotic. Obtain stool Hemoccults. If her symptoms persist consider colonoscopy.  3. Obesity. BMI=31. She requests information about gastric bypass and we have provided her with the contact phone number for the [facility] Surgery program.

## 2010-12-13 ENCOUNTER — Encounter: Payer: Self-pay | Admitting: Gastroenterology

## 2010-12-13 ENCOUNTER — Ambulatory Visit (INDEPENDENT_AMBULATORY_CARE_PROVIDER_SITE_OTHER): Payer: BC Managed Care – PPO | Admitting: Gastroenterology

## 2010-12-13 VITALS — BP 128/76 | HR 76 | Ht 66.0 in | Wt 192.0 lb

## 2010-12-13 DIAGNOSIS — K589 Irritable bowel syndrome without diarrhea: Secondary | ICD-10-CM

## 2010-12-13 DIAGNOSIS — K219 Gastro-esophageal reflux disease without esophagitis: Secondary | ICD-10-CM

## 2010-12-13 MED ORDER — ALIGN 4 MG PO CAPS: 1.0000 | ORAL_CAPSULE | Freq: Every day | ORAL | Status: DC

## 2010-12-13 NOTE — Patient Instructions (Addendum)
Start Align samples one tablet by mouth once daily x 1 month and if ineffective, please start Restora x 1 month and still ineffective, please take Florastor x 1 month. We want to see you back in 3 months after trying these probiotics.

## 2010-12-13 NOTE — Progress Notes (Signed)
History of Present Illness: This is a 55 year old female who returns for followup of a dull pain and diarrhea presumed to be irritable bowel syndrome. She states she has had no response from glycopyrrolate.   Current Medications, Allergies, Past Medical History, Past Surgical History, Family History and Social History were reviewed in [employer] record.   Physical Exam: General: Well developed , well nourished, no acute distress Head: Normocephalic and atraumatic Eyes:  sclerae anicteric, EOMI Ears: Normal auditory acuity Mouth: No deformity or lesions Lungs: Clear throughout to auscultation Heart: Regular rate and rhythm; no murmurs, rubs or bruits Abdomen: Soft, non tender and non distended. No masses, hepatosplenomegaly or hernias noted. Normal Bowel sounds Musculoskeletal: Symmetrical with no gross deformities  Neurological: Alert oriented x 4, grossly nonfocal Psychological:  Alert and cooperative. Normal mood and affect  Assessment and Recommendations:  1. Presumed IBS-D. Discontinue glycopyrrolate since it was not helpful. Trial of Align daily for 4 weeks and if not successful she will try Restora and then Florastor for 4 weeks each. Consider a trial of Xifaxan if above not successful.  2. GERD. Well controlled on pantoprazole 40 mg daily.

## 2011-02-08 LAB — CBC
HCT: 41.4
MCHC: 34.2
MCV: 84.8
Platelets: 306
WBC: 8.5

## 2011-02-08 LAB — COMPREHENSIVE METABOLIC PANEL
Albumin: 4.4
BUN: 18
Calcium: 9.9
Chloride: 99
Creatinine, Ser: 0.96
GFR calc non Af Amer: >60
Total Bilirubin: 0.5

## 2011-02-08 LAB — CARDIAC PANEL(CRET KIN+CKTOT+MB+TROPI)
Relative Index: INVALID
Troponin I: 0.01
Troponin I: 0.02

## 2011-02-08 LAB — TSH: TSH: 2.067

## 2011-02-08 LAB — HEPARIN LEVEL (UNFRACTIONATED): Heparin Unfractionated: 1.03 — ABNORMAL HIGH

## 2011-02-08 LAB — PROTIME-INR: INR: 0.9

## 2011-02-09 LAB — I-STAT 8, (EC8 V) (CONVERTED LAB)
Acid-Base Excess: 5 — ABNORMAL HIGH
Bicarbonate: 25.6 — ABNORMAL HIGH
Glucose, Bld: 105 — ABNORMAL HIGH
TCO2: 26
pCO2, Ven: 26.3 — ABNORMAL LOW
pH, Ven: 7.596 — ABNORMAL HIGH

## 2011-02-09 LAB — DIFFERENTIAL
Basophils Absolute: 0
Basophils Relative: 0
Eosinophils Absolute: 0.1
Eosinophils Relative: 1
Monocytes Absolute: 0.4
Monocytes Relative: 5
Neutro Abs: 4.4

## 2011-02-09 LAB — CBC
HCT: 37.3
Hemoglobin: 12.7
MCHC: 34.1
MCV: 84.5
RBC: 4.42
RDW: 13.6

## 2011-04-29 ENCOUNTER — Ambulatory Visit (INDEPENDENT_AMBULATORY_CARE_PROVIDER_SITE_OTHER): Payer: BC Managed Care – PPO

## 2011-04-29 DIAGNOSIS — J209 Acute bronchitis, unspecified: Secondary | ICD-10-CM

## 2011-04-29 DIAGNOSIS — R05 Cough: Secondary | ICD-10-CM

## 2011-04-29 DIAGNOSIS — R300 Dysuria: Secondary | ICD-10-CM

## 2011-05-07 ENCOUNTER — Other Ambulatory Visit: Payer: Self-pay | Admitting: Family Medicine

## 2011-05-07 DIAGNOSIS — Z1231 Encounter for screening mammogram for malignant neoplasm of breast: Secondary | ICD-10-CM

## 2011-05-14 ENCOUNTER — Ambulatory Visit
Admission: RE | Admit: 2011-05-14 | Discharge: 2011-05-14 | Disposition: A | Discharge status: Home / Self Care | Payer: BC Managed Care – PPO | Source: Ambulatory Visit | Attending: Family Medicine | Admitting: Family Medicine

## 2011-05-14 DIAGNOSIS — Z1231 Encounter for screening mammogram for malignant neoplasm of breast: Secondary | ICD-10-CM

## 2011-05-16 ENCOUNTER — Ambulatory Visit: Payer: BC Managed Care – PPO

## 2011-06-16 ENCOUNTER — Ambulatory Visit (INDEPENDENT_AMBULATORY_CARE_PROVIDER_SITE_OTHER): Payer: BC Managed Care – PPO | Admitting: Physician Assistant

## 2011-06-16 VITALS — BP 140/86 | HR 86 | Temp 98.0°F | Resp 16 | Ht 68.0 in | Wt 197.0 lb

## 2011-06-16 DIAGNOSIS — J019 Acute sinusitis, unspecified: Secondary | ICD-10-CM

## 2011-06-16 DIAGNOSIS — R05 Cough: Secondary | ICD-10-CM

## 2011-06-16 MED ORDER — BENZONATATE 100 MG PO CAPS: 100.0000 mg | ORAL_CAPSULE | Freq: Three times a day (TID) | ORAL | Status: AC | PRN

## 2011-06-16 MED ORDER — CEFDINIR 300 MG PO CAPS: 300.0000 mg | ORAL_CAPSULE | Freq: Two times a day (BID) | ORAL | Status: AC

## 2011-06-16 MED ORDER — IPRATROPIUM BROMIDE 0.06 % NA SOLN: 2.0000 | Freq: Two times a day (BID) | NASAL | Status: DC

## 2011-06-16 NOTE — Progress Notes (Signed)
Patient ID: Brandy [name] MRN: [id number], DOB: 05/23/1955, 56 y.o. Date of Encounter: 06/16/2011, 2:57 PM  Primary Physician: Mickie Hillier, MD, MD  Chief Complaint:  Chief Complaint  Patient presents with  . Sinusitis    2 days  . Cough    HPI: 56 y.o. year old female presents with 2 day history of worsening nasal congestion, sinus pressure, and cough. Began with sinus headache and sinus pressure, then progressed to nasal congestion, and cough. Afebrile through out. Cough is productive secondary to post nasal drip. Not associated with time of day. Nasal congestion thick and green. The above symptoms and timeline are c/w her previous sinus infections. SLlghtly decreased appetite secondary to post nasal drip. Has tried Aleave-D without good results. No GI symptoms. Positive sick contacts at work.   No leg trauma, sedentary periods, h/o cancer, or tobacco use.  Past Medical History  Diagnosis Date  . Hypertension   . ADD (attention deficit disorder)   . Esophagitis     erosive  . Hiatal hernia   . Anxiety   . Chronic headaches   . Hyperlipemia      Home Meds: Prior to Admission medications   Medication Sig Start Date End Date Taking? Authorizing Provider  alprazolam Prudy Feeler) 2 MG tablet Take 2 mg by mouth at bedtime as needed.     Yes Historical Provider, MD  amphetamine-dextroamphetamine (ADDERALL) 20 MG tablet Take 20 mg by mouth 2 (two) times daily.     Yes Historical Provider, MD  LISINOPRIL PO Take 12.5 mg by mouth daily.     Yes Historical Provider, MD  glycopyrrolate (ROBINUL-FORTE) 2 MG tablet Take 1 tablet (2 mg total) by mouth 2 (two) times daily. 11/06/10 11/06/11  Eliezer Bottom., MD,FACG  pantoprazole (PROTONIX) 40 MG tablet Take 1 tablet (40 mg total) by mouth daily. 11/06/10 11/06/11  Eliezer Bottom., MD,FACG  Probiotic Product (ALIGN) 4 MG CAPS Take 1 capsule by mouth daily. 12/13/10   Eliezer Bottom., MD,FACG  vitamin B-12 (CYANOCOBALAMIN) 100 MCG  tablet Take 50 mcg by mouth daily.      Historical Provider, MD    Allergies:  Allergies  Allergen Reactions  . Codeine Itching    History   Social History  . Marital Status: Widowed    Spouse Name: N/A    Number of Children: 5  . Years of Education: N/A   Occupational History  . Team Leader    Social History Main Topics  . Smoking status: Never Smoker   . Smokeless tobacco: Never Used  . Alcohol Use: No  . Drug Use: No  . Sexually Active: Not on file   Other Topics Concern  . Not on file   Social History Narrative  . No narrative on file     Review of Systems: Constitutional: negative for chills, fever, night sweats or weight changes Cardiovascular: negative for chest pain or palpitations Respiratory: negative for hemoptysis, wheezing, or shortness of breath Abdominal: negative for abdominal pain, nausea, vomiting or diarrhea Dermatological: negative for rash Neurologic: negative for headache   Physical Exam: Blood pressure 140/86, pulse 86, temperature 98 F (36.7 C), temperature source Oral, resp. rate 16, height 5\' 8"  (1.727 m), weight 197 lb (89.359 kg)., Body mass index is 29.95 kg/(m^2). General: Well developed, well nourished, in no acute distress. Head: Normocephalic, atraumatic, eyes without discharge, sclera non-icteric, nares are congested. Bilateral auditory canals clear, TM's are without perforation, pearly grey with reflective cone  of light bilaterally. Bilateral serous effusion behind TM's. Left maxillary sinus TTP. Oral cavity moist, dentition normal. Posterior pharynx with post nasal drip and mild erythema. No peritonsillar abscess or tonsillar exudate. Neck: Supple. No thyromegaly. Full ROM. No lymphadenopathy. Lungs: Clear bilaterally to auscultation without wheezes, rales, or rhonchi. Breathing is unlabored. Heart: RRR with S1 S2. No murmurs, rubs, or gallops appreciated. Msk:  Strength and tone normal for age. Extremities: No clubbing or  cyanosis. No edema. Neuro: Alert and oriented X 3. Moves all extremities spontaneously. CNII-XII grossly in tact. Psych:  Responds to questions appropriately with a normal affect.     ASSESSMENT AND PLAN:  56 y.o. year old female with sinusitis. -Omnicef 300 mg 1 po bid #20 no RF  -Atrovent NS 0.06% 2 sprays each nare bid prn #1 no RF -Tessalon Perles 100 mg 1 po tid prn cough #30 no RF  -Mucinex -Tylenol/Motrin prn -Rest/fluids -RTC precautions -RTC 3-5 days if no improvement  Signed, Eula Listen, PA-C 06/16/2011 2:57 PM

## 2013-04-04 ENCOUNTER — Encounter (HOSPITAL_COMMUNITY): Payer: Self-pay | Admitting: Emergency Medicine

## 2013-04-04 ENCOUNTER — Emergency Department (HOSPITAL_COMMUNITY)
Admission: EM | Admit: 2013-04-04 | Discharge: 2013-04-05 | Disposition: A | Discharge status: Home / Self Care | Payer: No Typology Code available for payment source | Attending: Emergency Medicine | Admitting: Emergency Medicine

## 2013-04-04 DIAGNOSIS — F411 Generalized anxiety disorder: Secondary | ICD-10-CM | POA: Insufficient documentation

## 2013-04-04 DIAGNOSIS — R109 Unspecified abdominal pain: Secondary | ICD-10-CM | POA: Insufficient documentation

## 2013-04-04 DIAGNOSIS — R319 Hematuria, unspecified: Secondary | ICD-10-CM | POA: Insufficient documentation

## 2013-04-04 DIAGNOSIS — I10 Essential (primary) hypertension: Secondary | ICD-10-CM | POA: Insufficient documentation

## 2013-04-04 DIAGNOSIS — Z90710 Acquired absence of both cervix and uterus: Secondary | ICD-10-CM | POA: Insufficient documentation

## 2013-04-04 DIAGNOSIS — M549 Dorsalgia, unspecified: Secondary | ICD-10-CM | POA: Insufficient documentation

## 2013-04-04 DIAGNOSIS — Z9089 Acquired absence of other organs: Secondary | ICD-10-CM | POA: Insufficient documentation

## 2013-04-04 DIAGNOSIS — E785 Hyperlipidemia, unspecified: Secondary | ICD-10-CM | POA: Insufficient documentation

## 2013-04-04 DIAGNOSIS — Z79899 Other long term (current) drug therapy: Secondary | ICD-10-CM | POA: Insufficient documentation

## 2013-04-04 DIAGNOSIS — K209 Esophagitis, unspecified without bleeding: Secondary | ICD-10-CM | POA: Insufficient documentation

## 2013-04-04 DIAGNOSIS — F988 Other specified behavioral and emotional disorders with onset usually occurring in childhood and adolescence: Secondary | ICD-10-CM | POA: Insufficient documentation

## 2013-04-04 DIAGNOSIS — Z9851 Tubal ligation status: Secondary | ICD-10-CM | POA: Insufficient documentation

## 2013-04-04 LAB — CBC WITH DIFFERENTIAL/PLATELET
Eosinophils Absolute: 0.2 10*3/uL (ref 0.0–0.7)
Eosinophils Relative: 2 % (ref 0–5)
Hemoglobin: 12.9 g/dL (ref 12.0–15.0)
Lymphocytes Relative: 38 % (ref 12–46)
Lymphs Abs: 3.5 10*3/uL (ref 0.7–4.0)
MCH: 28.9 pg (ref 26.0–34.0)
MCV: 89 fL (ref 78.0–100.0)
Monocytes Relative: 7 % (ref 3–12)
Neutrophils Relative %: 53 % (ref 43–77)
Platelets: 249 10*3/uL (ref 150–400)
RBC: 4.47 MIL/uL (ref 3.87–5.11)
WBC: 9.2 10*3/uL (ref 4.0–10.5)

## 2013-04-04 LAB — POCT I-STAT, CHEM 8
BUN: 28 mg/dL — ABNORMAL HIGH (ref 6–23)
Chloride: 104 mEq/L (ref 96–112)
Creatinine, Ser: 1.1 mg/dL (ref 0.50–1.10)
Sodium: 144 mEq/L (ref 135–145)

## 2013-04-04 LAB — URINALYSIS, ROUTINE W REFLEX MICROSCOPIC
Glucose, UA: NEGATIVE mg/dL
Leukocytes, UA: NEGATIVE
pH: 5.5 (ref 5.0–8.0)

## 2013-04-04 LAB — URINE MICROSCOPIC-ADD ON

## 2013-04-04 NOTE — ED Notes (Addendum)
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 Arbie Cookey) to assess, seen by GU MD Rosalita Levan, Dr. Joelyn Oms) Thursday. Also mentions frequency and retention. Reports pain at this time, 7/10. Took 1/2 vicodin earlier around lunchtime and it did not help.

## 2013-04-05 ENCOUNTER — Encounter (HOSPITAL_COMMUNITY): Payer: Self-pay | Admitting: Radiology

## 2013-04-05 ENCOUNTER — Emergency Department (HOSPITAL_COMMUNITY): Payer: No Typology Code available for payment source

## 2013-04-05 LAB — PROTIME-INR: Prothrombin Time: 13.2 seconds (ref 11.6–15.2)

## 2013-04-05 MED ORDER — SODIUM CHLORIDE 0.9 % IV BOLUS (SEPSIS)
500.0000 mL | Freq: Once | INTRAVENOUS | Status: AC
  Administered 2013-04-05: 01:00:00 via INTRAVENOUS

## 2013-04-05 MED ORDER — HYDROCODONE-ACETAMINOPHEN 5-325 MG PO TABS: 1.0000 | ORAL_TABLET | ORAL | Status: DC | PRN

## 2013-04-05 MED ORDER — MORPHINE SULFATE 4 MG/ML IJ SOLN: 4.0000 mg | Freq: Once | INTRAMUSCULAR | Status: DC

## 2013-04-05 MED ORDER — IOHEXOL 350 MG/ML SOLN
100.0000 mL | Freq: Once | INTRAVENOUS | Status: AC | PRN
  Administered 2013-04-05: 100 mL via INTRAVENOUS

## 2013-04-05 MED ORDER — MORPHINE SULFATE 4 MG/ML IJ SOLN
4.0000 mg | Freq: Once | INTRAMUSCULAR | Status: AC
  Administered 2013-04-05: 4 mg via INTRAVENOUS
  Filled 2013-04-05: qty 1

## 2013-04-05 MED ORDER — POTASSIUM CHLORIDE CRYS ER 20 MEQ PO TBCR
40.0000 meq | EXTENDED_RELEASE_TABLET | Freq: Once | ORAL | Status: AC
  Administered 2013-04-05: 40 meq via ORAL
  Filled 2013-04-05: qty 2

## 2013-04-05 NOTE — ED Provider Notes (Signed)
CSN: [id number]     Arrival date & time 04/04/13  2159 History   First MD Initiated Contact with Patient 04/05/13 0006     Chief Complaint  Patient presents with  . Flank Pain  . Hematuria   (Consider location/radiation/quality/duration/timing/severity/associated sxs/prior Treatment) HPI Patient presents with one day of constant right flank pain does not radiate. It started earlier this morning and has been constant. She has no nausea or vomiting associated with this. She did have some gross hematuria earlier in the day. 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 Arbie Cookey this month. She denies any abdominal pain, constipation or diarrhea. She's had no dysuria, hesitancy or frequency. She denies any fevers or chills. Patient states that she is taking several hydrocodone today without relief of her symptoms. Past Medical History  Diagnosis Date  . Hypertension   . ADD (attention deficit disorder)   . Esophagitis     erosive  . Hiatal hernia   . Anxiety   . Chronic headaches   . Hyperlipemia    Past Surgical History  Procedure Laterality Date  . Tubal ligation    . Cholecystectomy    . Appendectomy    . Abdominal hysterectomy    . Goiter removal     Family History  Problem Relation Age of Onset  . Coronary artery disease Father   . Coronary artery disease Mother   . Heart attack Father 5    Deceased  . Hypertension Brother   . Breast cancer Paternal Aunt   . Colon cancer Brother    History  Substance Use Topics  . Smoking status: Never Smoker   . Smokeless tobacco: Never Used  . Alcohol Use: No   OB History   Grav Para Term Preterm Abortions TAB SAB Ect Mult Living                 Review of Systems  Constitutional: Negative for fever and chills.  Respiratory: Negative for cough and shortness of breath.   Cardiovascular: Negative for chest pain, palpitations and leg swelling.  Gastrointestinal: Negative for nausea,  vomiting, abdominal pain, diarrhea and constipation.  Genitourinary: Positive for hematuria and flank pain. Negative for dysuria, vaginal bleeding, vaginal discharge, difficulty urinating and pelvic pain.  Musculoskeletal: Positive for back pain. Negative for myalgias, neck pain and neck stiffness.  Skin: Negative for rash and wound.  Neurological: Negative for dizziness, syncope, weakness, light-headedness, numbness and headaches.  All other systems reviewed and are negative.    Allergies  Codeine  Home Medications   Current Outpatient Rx  Name  Route  Sig  Dispense  Refill  . alprazolam (XANAX) 2 MG tablet   Oral   Take 2 mg by mouth 3 (three) times daily as needed for sleep or anxiety.          Marland Kitchen amphetamine-dextroamphetamine (ADDERALL) 20 MG tablet   Oral   Take 20 mg by mouth 2 (two) times daily.           Marland Kitchen esomeprazole (NEXIUM) 40 MG capsule   Oral   Take 40 mg by mouth daily at 12 noon.         Marland Kitchen lisinopril-hydrochlorothiazide (PRINZIDE,ZESTORETIC) 10-12.5 MG per tablet   Oral   Take 1 tablet by mouth daily.         Marland Kitchen losartan (COZAAR) 100 MG tablet   Oral   Take 100 mg by mouth daily.  BP 139/75  Pulse 90  Temp(Src) 97.4 F (36.3 C) (Oral)  Resp 24  Ht 5\' 7"  (1.702 m)  Wt 211 lb 9.6 oz (95.981 kg)  BMI 33.13 kg/m2  SpO2 97% Physical Exam  Nursing note and vitals reviewed. Constitutional: She is oriented to person, place, and time. She appears well-developed and well-nourished. No distress.  HENT:  Head: Normocephalic and atraumatic.  Mouth/Throat: Oropharynx is clear and moist.  Eyes: EOM are normal. Pupils are equal, round, and reactive to light.  Neck: Normal range of motion. Neck supple.  Cardiovascular: Normal rate and regular rhythm.   Pulmonary/Chest: Effort normal and breath sounds normal. No respiratory distress. She has no wheezes. She has no rales. She exhibits no tenderness.  Abdominal: Soft. Bowel sounds are normal. She  exhibits no distension and no mass. There is no tenderness. There is no rebound and no guarding.  Musculoskeletal: Normal range of motion. She exhibits no edema and no tenderness.  No CVA tenderness bilaterally.  Neurological: She is alert and oriented to person, place, and time.  Extremities without deficit. Sensation grossly intact.  Skin: Skin is warm and dry. No rash noted. No erythema.  Psychiatric: She has a normal mood and affect. Her behavior is normal.    ED Course  Procedures (including critical care time) Labs Review Labs Reviewed  URINALYSIS, ROUTINE W REFLEX MICROSCOPIC - Abnormal; Notable for the following:    Hgb urine dipstick LARGE (*)    All other components within normal limits  POCT I-STAT, CHEM 8 - Abnormal; Notable for the following:    Potassium 3.1 (*)    BUN 28 (*)    Glucose, Bld 104 (*)    All other components within normal limits  CBC WITH DIFFERENTIAL  URINE MICROSCOPIC-ADD ON  PROTIME-INR  APTT   Imaging Review No results found.  EKG Interpretation   None       MDM  Concern for renal artery dissection or worsening aneurysm given her gross hematuria and constant flank pain. Will get CT angio of the abdomen and pelvis to evaluate.  Patient's symptoms have improved though she is having a mild return of her pain. Her CT angio is without acute changes. Laboratory examination is reassuring. Have her placed her potassium with by mouth potassium in the emergency department. Advised her following up with Dr. early her vascular surgeon. Return precautions have been given including worsening pain, fever, increased hematuria, persistent vomiting or for any concerns.  Loren Racer, MD 04/05/13 [phone]

## 2013-05-25 ENCOUNTER — Encounter: Payer: Self-pay | Admitting: Vascular Surgery

## 2013-05-26 ENCOUNTER — Other Ambulatory Visit: Payer: Self-pay | Admitting: *Deleted

## 2013-05-26 ENCOUNTER — Ambulatory Visit (INDEPENDENT_AMBULATORY_CARE_PROVIDER_SITE_OTHER): Payer: BC Managed Care – PPO | Admitting: Vascular Surgery

## 2013-05-26 ENCOUNTER — Encounter: Payer: Self-pay | Admitting: Vascular Surgery

## 2013-05-26 ENCOUNTER — Encounter: Payer: Self-pay | Admitting: *Deleted

## 2013-05-26 ENCOUNTER — Encounter (INDEPENDENT_AMBULATORY_CARE_PROVIDER_SITE_OTHER): Payer: Self-pay

## 2013-05-26 VITALS — BP 158/85 | HR 72 | Ht 67.0 in | Wt 204.6 lb

## 2013-05-26 DIAGNOSIS — I722 Aneurysm of renal artery: Secondary | ICD-10-CM

## 2013-05-26 NOTE — Progress Notes (Signed)
Patient name: Brandy [name] MRN: [id number] DOB: [birth year] Sex: female   Referred by: Dr Saddie Benders  Reason for referral:  Chief Complaint  Patient presents with  . 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 She has a long history of abdominal complaints including diarrhea and abdominal pain. She also has chronic low back pain and is on chronic narcotic use for this. She had an episode of worsening diarrhea and abdominal pain in November and underwent a CT scan for evaluation. 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 She has undergone workup for hematuria with Dr.Chao. Do not have the results of this but she reports cystoscopy which was negative. He is quite anxious regarding the diagnosis and is quite worried regarding this. She does have a 2 year history of intermittent gross hematuria. She does report a back pain but feels that it is more in her right side her left side. Does have a history of prior cardiac catheterization for chest pain in this showed no evidence of coronary disease. She has no other history of peripheral vascular occlusive disease.  Past Medical History  Diagnosis Date  . Hypertension   . ADD (attention deficit disorder)   . Esophagitis     erosive  . Hiatal hernia   . Anxiety   . Chronic headaches   . Hyperlipemia     Past Surgical History  Procedure Laterality Date  . Tubal ligation    . Cholecystectomy    . Appendectomy    . Abdominal hysterectomy    . Goiter removal      History   Social History  . Marital Status: Widowed    Spouse Name: N/A    Number of Children: 5  . Years of Education: N/A   Occupational History  . Team Leader    Social History Main Topics  . Smoking status: Never Smoker   . Smokeless tobacco: Never  Used  . Alcohol Use: No  . Drug Use: No  . Sexual Activity: Not on file   Other Topics Concern  . Not on file   Social History Narrative  . No narrative on file    Family History  Problem Relation Age of Onset  . Coronary artery disease Father   . Heart attack Father 84    Deceased  . Hyperlipidemia Father   . Heart disease Father     before age 64  . Coronary artery disease Mother   . Heart disease Mother   . Hyperlipidemia Mother   . Hypertension Mother   . Bleeding Disorder Mother   . Hypertension Brother   . Cancer Brother   . Hyperlipidemia Brother   . Breast cancer Paternal Aunt   . Colon cancer Brother   . Hyperlipidemia Sister   . Hypertension Sister     Allergies as of 05/26/2013 - Review Complete 05/26/2013  Allergen Reaction Noted  . Codeine Itching 11/06/2010    Current Outpatient Prescriptions on File Prior to Visit  Medication Sig Dispense Refill  . alprazolam (XANAX) 2 MG tablet Take 2 mg by mouth 3 (three) times daily as needed for sleep or anxiety.       Marland Kitchen amphetamine-dextroamphetamine (ADDERALL) 20 MG tablet Take 20 mg by mouth 2 (two)  times daily.        Marland Kitchen. esomeprazole (NEXIUM) 40 MG capsule Take 40 mg by mouth daily at 12 noon.      Marland Kitchen. HYDROcodone-acetaminophen (NORCO) 5-325 MG per tablet Take 1 tablet by mouth every 4 (four) hours as needed for moderate pain.  10 tablet  0  . lisinopril-hydrochlorothiazide (PRINZIDE,ZESTORETIC) 10-12.5 MG per tablet Take 1 tablet by mouth daily.      Marland Kitchen. losartan (COZAAR) 100 MG tablet Take 100 mg by mouth daily.       No current facility-administered medications on file prior to visit.     REVIEW OF SYSTEMS:  Positives indicated with an "X"  CARDIOVASCULAR:  [ ]  chest pain   [ ]  chest pressure   [ ]  palpitations   [ ]  orthopnea   [x ] dyspnea on exertion   [ ]  claudication   [ ]  rest pain   [ ]  DVT   [ ]  phlebitis PULMONARY:   [ ]  productive cough   [ ]  asthma   [ ]  wheezing NEUROLOGIC:   [ ]  weakness  [x  ] paresthesias  [ ]  aphasia  [ ]  amaurosis  [ x] dizziness HEMATOLOGIC:   [ ]  bleeding problems   [ ]  clotting disorders MUSCULOSKELETAL:  [ ]  joint pain   [ ]  joint swelling GASTROINTESTINAL: [ ]   blood in stool  [ ]   hematemesis GENITOURINARY:  [ ]   dysuria  [ ]   hematuria PSYCHIATRIC:  [ ]  history of major depression INTEGUMENTARY:  [ ]  rashes  [ ]  ulcers CONSTITUTIONAL:  [ ]  fever   [ ]  chills  PHYSICAL EXAMINATION:  General: The patient is a well-nourished female, in no acute distress. Vital signs are BP 158/85  Pulse 72  Ht 5\' 7"  (1.702 m)  Wt 204 lb 9.6 oz (92.806 kg)  BMI 32.04 kg/m2  SpO2 100% Pulmonary: There is a good air exchange bilaterally without wheezing or rales. Abdomen: Soft and non-tender with normal pitch bowel sounds. No bruits noted Musculoskeletal: There are no major deformities.  There is no significant extremity pain. Neurologic: No focal weakness or paresthesias are detected, Skin: There are no ulcer or rashes noted. Psychiatric: The patient has normal affect. Cardiovascular: There is a regular rate and rhythm without significant murmur appreciated. Carotid arteries without bruits bilaterally Pulse status 2+ radial 2+ femoral and 2+ dorsalis pedis pulses bilaterally  CT scan from one month ago was reviewed. This does also compared to the November 2014 study. There was no change between the 2. Her scan does show a 1.2 cm renal artery aneurysm risen and the renal hilum. She also has a small 0.8 cm aneurysm in her mid splenic artery.  Impression and Plan:  Very complex patient. I had a long discussion with her. I explained that in all likelihood this is an asymptomatic incidental finding. Certainly with her hematuria with no other cause this is concerning. I did explain the possibility of AV malformation in her kidney which would not necessarily show up on her CT scan. I explained to be quite unlikely that the pain was related to this as well. I have  recommended formal renal arteriogram for further evaluation of her renal arterial and venous anatomy. We will schedule this as an outpatient. I explained this may not give us any guidance as far as what would be required for recommendation for treatment. Her arteriogram is scheduled for 06/03/2013    Roshell Brigham [facility]  Office: [phone]

## 2013-06-02 ENCOUNTER — Other Ambulatory Visit: Payer: Self-pay | Admitting: *Deleted

## 2013-06-03 ENCOUNTER — Encounter (HOSPITAL_COMMUNITY): Payer: Self-pay | Admitting: Pharmacy Technician

## 2013-06-03 ENCOUNTER — Other Ambulatory Visit: Payer: Self-pay

## 2013-06-03 MED ORDER — SODIUM CHLORIDE 0.9 % IV SOLN: INTRAVENOUS | Status: DC

## 2013-06-04 ENCOUNTER — Encounter (HOSPITAL_COMMUNITY)
Admission: RE | Disposition: A | Discharge status: Home / Self Care | Payer: BC Managed Care – PPO | Source: Ambulatory Visit | Attending: Vascular Surgery

## 2013-06-04 ENCOUNTER — Ambulatory Visit (HOSPITAL_COMMUNITY)
Admission: RE | Admit: 2013-06-04 | Discharge: 2013-06-04 | Disposition: A | Discharge status: Home / Self Care | Payer: BC Managed Care – PPO | Source: Ambulatory Visit | Attending: Vascular Surgery | Admitting: Vascular Surgery

## 2013-06-04 ENCOUNTER — Other Ambulatory Visit: Payer: Self-pay

## 2013-06-04 ENCOUNTER — Encounter (HOSPITAL_COMMUNITY): Admission: RE | Payer: Self-pay | Source: Ambulatory Visit

## 2013-06-04 ENCOUNTER — Ambulatory Visit (HOSPITAL_COMMUNITY)
Admission: RE | Admit: 2013-06-04 | Payer: BC Managed Care – PPO | Source: Ambulatory Visit | Admitting: Vascular Surgery

## 2013-06-04 ENCOUNTER — Telehealth: Payer: Self-pay | Admitting: Vascular Surgery

## 2013-06-04 DIAGNOSIS — I728 Aneurysm of other specified arteries: Secondary | ICD-10-CM

## 2013-06-04 DIAGNOSIS — I722 Aneurysm of renal artery: Secondary | ICD-10-CM | POA: Insufficient documentation

## 2013-06-04 DIAGNOSIS — Z8249 Family history of ischemic heart disease and other diseases of the circulatory system: Secondary | ICD-10-CM | POA: Insufficient documentation

## 2013-06-04 DIAGNOSIS — F988 Other specified behavioral and emotional disorders with onset usually occurring in childhood and adolescence: Secondary | ICD-10-CM | POA: Insufficient documentation

## 2013-06-04 DIAGNOSIS — K209 Esophagitis, unspecified without bleeding: Secondary | ICD-10-CM | POA: Insufficient documentation

## 2013-06-04 DIAGNOSIS — E785 Hyperlipidemia, unspecified: Secondary | ICD-10-CM | POA: Insufficient documentation

## 2013-06-04 DIAGNOSIS — F411 Generalized anxiety disorder: Secondary | ICD-10-CM | POA: Insufficient documentation

## 2013-06-04 DIAGNOSIS — I10 Essential (primary) hypertension: Secondary | ICD-10-CM | POA: Insufficient documentation

## 2013-06-04 DIAGNOSIS — K449 Diaphragmatic hernia without obstruction or gangrene: Secondary | ICD-10-CM | POA: Insufficient documentation

## 2013-06-04 HISTORY — PX: ABDOMINAL AORTAGRAM: SHX5454

## 2013-06-04 HISTORY — PX: RENAL ANGIOGRAM: SHX5509

## 2013-06-04 LAB — POCT I-STAT, CHEM 8
BUN: 21 mg/dL (ref 6–23)
CHLORIDE: 106 meq/L (ref 96–112)
Calcium, Ion: 1.18 mmol/L (ref 1.12–1.23)
Creatinine, Ser: 0.7 mg/dL (ref 0.50–1.10)
GLUCOSE: 108 mg/dL — AB (ref 70–99)
HEMATOCRIT: 43 % (ref 36.0–46.0)
Hemoglobin: 14.6 g/dL (ref 12.0–15.0)
Potassium: 4.2 mEq/L (ref 3.7–5.3)
Sodium: 142 mEq/L (ref 137–147)
TCO2: 27 mmol/L (ref 0–100)

## 2013-06-04 SURGERY — ABDOMINAL AORTAGRAM
Anesthesia: LOCAL

## 2013-06-04 MED ORDER — LIDOCAINE HCL (PF) 1 % IJ SOLN
INTRAMUSCULAR | Status: AC
  Filled 2013-06-04: qty 30

## 2013-06-04 MED ORDER — ACETAMINOPHEN 325 MG PO TABS
650.0000 mg | ORAL_TABLET | ORAL | Status: DC | PRN
Start: 2013-06-04 — End: 2013-06-04

## 2013-06-04 MED ORDER — HEPARIN (PORCINE) IN NACL 2-0.9 UNIT/ML-% IJ SOLN
INTRAMUSCULAR | Status: AC
  Filled 2013-06-04: qty 1000

## 2013-06-04 MED ORDER — ACETAMINOPHEN 325 MG PO TABS: 650.0000 mg | ORAL_TABLET | ORAL | Status: DC | PRN

## 2013-06-04 MED ORDER — SODIUM CHLORIDE 0.9 % IV SOLN: 1.0000 mL/kg/h | INTRAVENOUS | Status: DC

## 2013-06-04 MED ORDER — ONDANSETRON HCL 4 MG/2ML IJ SOLN: 4.0000 mg | Freq: Four times a day (QID) | INTRAMUSCULAR | Status: DC | PRN

## 2013-06-04 MED ORDER — MIDAZOLAM HCL 2 MG/2ML IJ SOLN
INTRAMUSCULAR | Status: AC
  Filled 2013-06-04: qty 2

## 2013-06-04 MED ORDER — FENTANYL CITRATE 0.05 MG/ML IJ SOLN
INTRAMUSCULAR | Status: AC
  Filled 2013-06-04: qty 2

## 2013-06-04 NOTE — Telephone Encounter (Addendum)
Message copied by Fredrich BirksMILLIKAN, DANA P on Thu Jun 04, 2013  3:26 PM ------      Message from: Lorin MercyMCCHESNEY, MARILYN K      Created: Thu Jun 04, 2013 11:55 AM      Regarding: Schedule                   ----- Message -----         From: Sharee PimpleMarilyn K McChesney, CMA         Sent: 06/04/2013  11:14 AM           To: [facility] Charge Pool      Subject: Schedule                                                             ----- Message -----         From: Fransisco HertzBrian L Chen, MD         Sent: 06/04/2013   8:13 AM           To: Reuel DerbySusan H Large, [facility] Charge [address]Pool            Jackey LogeRamona S Melendrez      [id number]      [birth year]            PROCEDURE:      1.  Right common femoral artery cannulation under ultrasound guidance      2.  Aortogram      3.  Selection of right renal artery      4.  Right renal angiogram                  Follow-up: 4 weeks ------  06/04/13: spoke with patient, dpm

## 2013-06-04 NOTE — Interval H&P Note (Signed)
[facility] [place]  History and Physical Update  The patient was interviewed and re-examined.  The patient's previous History and Physical has been reviewed and is unchanged.  There is no change in the plan of care: aortogram, possible celiac and renal angiogram.  I discussed with the patient the nature of angiographic procedures, especially the limited patencies of any endovascular intervention.  The patient is aware of that the risks of an angiographic procedure include but are not limited to: bleeding, infection, access site complications, renal failure, embolization, rupture of vessel, dissection, possible need for emergent surgical intervention, possible need for surgical procedures to treat the patient's pathology, anaphylactic reaction to contrast, and stroke and death.  The patient is aware of the risks and agrees to proceed.  Leonides SakeBrian Tiajah Oyster, MD [facility] [place] Office: [phone] Pager: [phone]  06/04/2013, 7:14 AM

## 2013-06-04 NOTE — Progress Notes (Signed)
Patient ID: Brandy [name], female   DOB: May 19, 1955, 58 y.o.   MRN: [id number] The patient underwent selective renal angiogram today for evaluation of her 1.2 cm hilar renal aneurysm on the right. I seen her in the office. She's had several CT scan showing no change in this. I explained that in all likelihood this was asymptomatic was concern regarding some right flank pain and some hematuria. She underwent arteriogram today which showed an aneurysm out in the hilum. The flow lumen was between 6 and 7 mm. It was no evidence of AV malformation. I will discuss this with the patient. I think there is very little chance that this is causing her flank pain or her hematuria. This is in light of a CT scan over several occasions showing no change. If this persists we would [facility] They have extensive experience and complex renal vascular issues. She will followup with Dr.Chao if she continues to have hematuria

## 2013-06-04 NOTE — Op Note (Signed)
   OPERATIVE NOTE   PROCEDURE: 1.  Right common femoral artery cannulation under ultrasound guidance 2.  Aortogram 3.  Selection of right renal artery 4.  Right renal angiogram  PRE-OPERATIVE DIAGNOSIS: Right renal artery and splenic aneurysm  POST-OPERATIVE DIAGNOSIS: same as above   SURGEON: Leonides SakeBrian Chen, MD  ANESTHESIA: conscious sedation  ESTIMATED BLOOD LOSS: 30 cc  CONTRAST: 40 cc  FINDING(S):  Aorta: Patent  Superior mesenteric artery: Patent  Celiac artery: Patent, small splenic artery aneurysm (8 mm in diameter)  Right renal artery:   Patent with hilar aneurysm (1.3 mm x 8 mm max dimensions)  Left renal artery:   Patent with no aneurysm  SPECIMEN(S):  none  INDICATIONS:   Brandy LogeRamona S [name] is a 58 y.o. female who presents with abdominal pain.  On CTA, a right renal artery aneurysm and splenic artery aneurysm was imaged.  The patient presents for: aortogram and right renal angiogram to confirm diagnosis.  I discussed with the patient the nature of angiographic procedures, especially the limited patencies of any endovascular intervention.  The patient is aware of that the risks of an angiographic procedure include but are not limited to: bleeding, infection, access site complications, renal failure, embolization, rupture of vessel, dissection, possible need for emergent surgical intervention, possible need for surgical procedures to treat the patient's pathology, and stroke and death.  The patient is aware of the risks and agrees to proceed.  DESCRIPTION: After full informed consent was obtained from the patient, the patient was brought back to the angiography suite.  The patient was placed supine upon the angiography table and connected to monitoring equipment.  The patient was then given conscious sedation, the amounts of which are documented in the patient's chart.  The patient was prepped and drape in the standard fashion for an angiographic procedure.  At this point,  attention was turned to the right groin.  Under ultrasound guidance, the right common femoral artery will be cannulated with a 18 gauge needle.  The [facility] wire would not pass up the artery, so I pulled out the wire and needle and held pressure for 5 minutes.  Under ultrasound guidance, the right common femoral artery was cannulated with a micropuncture needle.  The microwire was advanced into the iliac arterial system.  The needle was exchanged for a microsheath, which was loaded into the common femoral artery over the wire.  The microwire was exchanged for a [facility] wire which was advanced into the aorta.  The microsheath was then exchanged for a 5-Fr sheath which was loaded into the common femoral artery.  The Omniflush catheter was then loaded over the wire up to the level of L1.  The catheter was connected to the power injector circuit.  After de-airring and de-clotting the circuit, a power injector aortogram was completed.  The catheter was exchanged for a SOS catheter.  Using this catheter and wire, I selected the right renal artery.  I lodged the catheter into the proximal renal artery.  Two selective injections were completed with power injector to image the right renal artery.  The findings are listed above.  The patient will return to Dr. Arbie CookeyEarly for management of her splenic and right renal artery aneurysms  COMPLICATIONS: none  CONDITION: stable  Leonides SakeBrian Chen, MD [facility] [place] Office: [phone] Pager: [phone]  06/04/2013, 8:07 AM

## 2013-06-04 NOTE — H&P (View-Only) (Signed)
Patient name: Brandy [name] MRN: [id number] DOB: [birth year] Sex: female   Referred by: Dr Saddie Benders  Reason for referral:  Chief Complaint  Patient presents with  . 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 She has a long history of abdominal complaints including diarrhea and abdominal pain. She also has chronic low back pain and is on chronic narcotic use for this. She had an episode of worsening diarrhea and abdominal pain in November and underwent a CT scan for evaluation. 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 She has undergone workup for hematuria with Dr.Chao. Do not have the results of this but she reports cystoscopy which was negative. He is quite anxious regarding the diagnosis and is quite worried regarding this. She does have a 2 year history of intermittent gross hematuria. She does report a back pain but feels that it is more in her right side her left side. Does have a history of prior cardiac catheterization for chest pain in this showed no evidence of coronary disease. She has no other history of peripheral vascular occlusive disease.  Past Medical History  Diagnosis Date  . Hypertension   . ADD (attention deficit disorder)   . Esophagitis     erosive  . Hiatal hernia   . Anxiety   . Chronic headaches   . Hyperlipemia     Past Surgical History  Procedure Laterality Date  . Tubal ligation    . Cholecystectomy    . Appendectomy    . Abdominal hysterectomy    . Goiter removal      History   Social History  . Marital Status: Widowed    Spouse Name: N/A    Number of Children: 5  . Years of Education: N/A   Occupational History  . Team Leader    Social History Main Topics  . Smoking status: Never Smoker   . Smokeless tobacco: Never  Used  . Alcohol Use: No  . Drug Use: No  . Sexual Activity: Not on file   Other Topics Concern  . Not on file   Social History Narrative  . No narrative on file    Family History  Problem Relation Age of Onset  . Coronary artery disease Father   . Heart attack Father 60    Deceased  . Hyperlipidemia Father   . Heart disease Father     before age 64  . Coronary artery disease Mother   . Heart disease Mother   . Hyperlipidemia Mother   . Hypertension Mother   . Bleeding Disorder Mother   . Hypertension Brother   . Cancer Brother   . Hyperlipidemia Brother   . Breast cancer Paternal Aunt   . Colon cancer Brother   . Hyperlipidemia Sister   . Hypertension Sister     Allergies as of 05/26/2013 - Review Complete 05/26/2013  Allergen Reaction Noted  . Codeine Itching 11/06/2010    Current Outpatient Prescriptions on File Prior to Visit  Medication Sig Dispense Refill  . alprazolam (XANAX) 2 MG tablet Take 2 mg by mouth 3 (three) times daily as needed for sleep or anxiety.       Marland Kitchen amphetamine-dextroamphetamine (ADDERALL) 20 MG tablet Take 20 mg by mouth 2 (two)  times daily.        Marland Kitchen. esomeprazole (NEXIUM) 40 MG capsule Take 40 mg by mouth daily at 12 noon.      Marland Kitchen. HYDROcodone-acetaminophen (NORCO) 5-325 MG per tablet Take 1 tablet by mouth every 4 (four) hours as needed for moderate pain.  10 tablet  0  . lisinopril-hydrochlorothiazide (PRINZIDE,ZESTORETIC) 10-12.5 MG per tablet Take 1 tablet by mouth daily.      Marland Kitchen. losartan (COZAAR) 100 MG tablet Take 100 mg by mouth daily.       No current facility-administered medications on file prior to visit.     REVIEW OF SYSTEMS:  Positives indicated with an "X"  CARDIOVASCULAR:  [ ]  chest pain   [ ]  chest pressure   [ ]  palpitations   [ ]  orthopnea   [x ] dyspnea on exertion   [ ]  claudication   [ ]  rest pain   [ ]  DVT   [ ]  phlebitis PULMONARY:   [ ]  productive cough   [ ]  asthma   [ ]  wheezing NEUROLOGIC:   [ ]  weakness  [x  ] paresthesias  [ ]  aphasia  [ ]  amaurosis  [ x] dizziness HEMATOLOGIC:   [ ]  bleeding problems   [ ]  clotting disorders MUSCULOSKELETAL:  [ ]  joint pain   [ ]  joint swelling GASTROINTESTINAL: [ ]   blood in stool  [ ]   hematemesis GENITOURINARY:  [ ]   dysuria  [ ]   hematuria PSYCHIATRIC:  [ ]  history of major depression INTEGUMENTARY:  [ ]  rashes  [ ]  ulcers CONSTITUTIONAL:  [ ]  fever   [ ]  chills  PHYSICAL EXAMINATION:  General: The patient is a well-nourished female, in no acute distress. Vital signs are BP 158/85  Pulse 72  Ht 5\' 7"  (1.702 m)  Wt 204 lb 9.6 oz (92.806 kg)  BMI 32.04 kg/m2  SpO2 100% Pulmonary: There is a good air exchange bilaterally without wheezing or rales. Abdomen: Soft and non-tender with normal pitch bowel sounds. No bruits noted Musculoskeletal: There are no major deformities.  There is no significant extremity pain. Neurologic: No focal weakness or paresthesias are detected, Skin: There are no ulcer or rashes noted. Psychiatric: The patient has normal affect. Cardiovascular: There is a regular rate and rhythm without significant murmur appreciated. Carotid arteries without bruits bilaterally Pulse status 2+ radial 2+ femoral and 2+ dorsalis pedis pulses bilaterally  CT scan from one month ago was reviewed. This does also compared to the November 2014 study. There was no change between the 2. Her scan does show a 1.2 cm renal artery aneurysm risen and the renal hilum. She also has a small 0.8 cm aneurysm in her mid splenic artery.  Impression and Plan:  Very complex patient. I had a long discussion with her. I explained that in all likelihood this is an asymptomatic incidental finding. Certainly with her hematuria with no other cause this is concerning. I did explain the possibility of AV malformation in her kidney which would not necessarily show up on her CT scan. I explained to be quite unlikely that the pain was related to this as well. I have  recommended formal renal arteriogram for further evaluation of her renal arterial and venous anatomy. We will schedule this as an outpatient. I explained this may not give us any guidance as far as what would be required for recommendation for treatment. Her arteriogram is scheduled for 06/03/2013    Foxx Klarich [facility]  Office: [phone]

## 2013-06-04 NOTE — Discharge Instructions (Signed)
Angiography, Care After °Refer to this sheet in the next few weeks. These instructions provide you with information on caring for yourself after your procedure. Your health care provider may also give you more specific instructions. Your treatment has been planned according to current medical practices, but problems sometimes occur. Call your health care provider if you have any problems or questions after your procedure.  °WHAT TO EXPECT AFTER THE PROCEDURE °After your procedure, it is typical to have the following sensations: °· Minor discomfort or tenderness and a small bump at the catheter insertion site. The bump should usually decrease in size and tenderness within 1 to 2 weeks. °· Any bruising will usually fade within 2 to 4 weeks. °HOME CARE INSTRUCTIONS  °· You may need to keep taking blood thinners if they were prescribed for you. Only take over-the-counter or prescription medicines for pain, fever, or discomfort as directed by your health care provider. °· Do not apply powder or lotion to the site. °· Do not sit in a bathtub, swimming pool, or whirlpool for 5 to 7 days. °· You may shower 24 hours after the procedure. Remove the bandage (dressing) and gently wash the site with plain soap and water. Gently pat the site dry. °· Inspect the site at least twice daily. °· Limit your activity for the first 24 hours. Do not bend, squat, or lift anything over 10 lb (9 kg) or as directed by your health care provider. °· Do not drive home if you are discharged the day of the procedure. Have someone else drive you. Follow instructions about when you can drive or return to work. °SEEK MEDICAL CARE IF: °· You get lightheaded when standing up. °· You have drainage (other than a small amount of blood on the dressing). °· You have chills. °· You have a fever. °· You have redness, warmth, swelling, or pain at the insertion site. °SEEK IMMEDIATE MEDICAL CARE IF:  °· You develop chest pain or shortness of breath, feel faint,  or pass out. °· You have bleeding, swelling larger than a walnut, or drainage from the catheter insertion site. °· You develop pain, discoloration, coldness, or severe bruising in the leg or arm that held the catheter. °· You have heavy bleeding from the site. If this happens, hold pressure on the site and call 911. °MAKE SURE YOU: °· Understand these instructions. °· Will watch your condition. °· Will get help right away if you are not doing well or get worse. °Document Released: 11/02/2004 Document Revised: 12/17/2012 Document Reviewed: 09/08/2012 °Exit[employer]® Patient Information ©2014 [employer]. ° °

## 2013-07-02 ENCOUNTER — Encounter: Payer: Self-pay | Admitting: Vascular Surgery

## 2013-07-03 ENCOUNTER — Encounter: Payer: BC Managed Care – PPO | Admitting: Vascular Surgery

## 2013-09-14 ENCOUNTER — Encounter (HOSPITAL_COMMUNITY): Payer: Self-pay | Admitting: Emergency Medicine

## 2013-09-14 ENCOUNTER — Emergency Department (HOSPITAL_COMMUNITY)
Admission: EM | Admit: 2013-09-14 | Discharge: 2013-09-14 | Disposition: A | Discharge status: Home / Self Care | Payer: BC Managed Care – PPO | Attending: Emergency Medicine | Admitting: Emergency Medicine

## 2013-09-14 ENCOUNTER — Emergency Department (HOSPITAL_COMMUNITY): Payer: BC Managed Care – PPO

## 2013-09-14 DIAGNOSIS — Z862 Personal history of diseases of the blood and blood-forming organs and certain disorders involving the immune mechanism: Secondary | ICD-10-CM | POA: Insufficient documentation

## 2013-09-14 DIAGNOSIS — Z8639 Personal history of other endocrine, nutritional and metabolic disease: Secondary | ICD-10-CM | POA: Insufficient documentation

## 2013-09-14 DIAGNOSIS — R110 Nausea: Secondary | ICD-10-CM | POA: Insufficient documentation

## 2013-09-14 DIAGNOSIS — Z8719 Personal history of other diseases of the digestive system: Secondary | ICD-10-CM | POA: Insufficient documentation

## 2013-09-14 DIAGNOSIS — Z79899 Other long term (current) drug therapy: Secondary | ICD-10-CM | POA: Insufficient documentation

## 2013-09-14 DIAGNOSIS — R109 Unspecified abdominal pain: Secondary | ICD-10-CM | POA: Insufficient documentation

## 2013-09-14 DIAGNOSIS — Z90710 Acquired absence of both cervix and uterus: Secondary | ICD-10-CM | POA: Insufficient documentation

## 2013-09-14 DIAGNOSIS — F411 Generalized anxiety disorder: Secondary | ICD-10-CM | POA: Insufficient documentation

## 2013-09-14 DIAGNOSIS — Z9089 Acquired absence of other organs: Secondary | ICD-10-CM | POA: Insufficient documentation

## 2013-09-14 DIAGNOSIS — Z9851 Tubal ligation status: Secondary | ICD-10-CM | POA: Insufficient documentation

## 2013-09-14 DIAGNOSIS — R300 Dysuria: Secondary | ICD-10-CM | POA: Insufficient documentation

## 2013-09-14 DIAGNOSIS — I10 Essential (primary) hypertension: Secondary | ICD-10-CM | POA: Insufficient documentation

## 2013-09-14 DIAGNOSIS — Z9889 Other specified postprocedural states: Secondary | ICD-10-CM | POA: Insufficient documentation

## 2013-09-14 DIAGNOSIS — G8929 Other chronic pain: Secondary | ICD-10-CM | POA: Insufficient documentation

## 2013-09-14 DIAGNOSIS — F988 Other specified behavioral and emotional disorders with onset usually occurring in childhood and adolescence: Secondary | ICD-10-CM | POA: Insufficient documentation

## 2013-09-14 DIAGNOSIS — R319 Hematuria, unspecified: Secondary | ICD-10-CM | POA: Insufficient documentation

## 2013-09-14 LAB — URINALYSIS, ROUTINE W REFLEX MICROSCOPIC
BILIRUBIN URINE: NEGATIVE
GLUCOSE, UA: NEGATIVE mg/dL
Ketones, ur: NEGATIVE mg/dL
Leukocytes, UA: NEGATIVE
Nitrite: NEGATIVE
Protein, ur: NEGATIVE mg/dL
Specific Gravity, Urine: 1.02 (ref 1.005–1.030)
Urobilinogen, UA: 1 mg/dL (ref 0.0–1.0)
pH: 7 (ref 5.0–8.0)

## 2013-09-14 LAB — COMPREHENSIVE METABOLIC PANEL
ALT: 16 U/L (ref 0–35)
AST: 13 U/L (ref 0–37)
Albumin: 3.5 g/dL (ref 3.5–5.2)
Alkaline Phosphatase: 89 U/L (ref 39–117)
BILIRUBIN TOTAL: 0.4 mg/dL (ref 0.3–1.2)
BUN: 21 mg/dL (ref 6–23)
CHLORIDE: 103 meq/L (ref 96–112)
CO2: 28 mEq/L (ref 19–32)
CREATININE: 0.84 mg/dL (ref 0.50–1.10)
Calcium: 8.8 mg/dL (ref 8.4–10.5)
GFR calc non Af Amer: 75 mL/min — ABNORMAL LOW (ref >90)
GFR, EST AFRICAN AMERICAN: 86 mL/min — AB (ref >90)
Glucose, Bld: 92 mg/dL (ref 70–99)
Potassium: 3.9 mEq/L (ref 3.7–5.3)
Sodium: 141 mEq/L (ref 137–147)
Total Protein: 6.5 g/dL (ref 6.0–8.3)

## 2013-09-14 LAB — CBC WITH DIFFERENTIAL/PLATELET
Basophils Absolute: 0 10*3/uL (ref 0.0–0.1)
Basophils Relative: 0 % (ref 0–1)
EOS ABS: 0.1 10*3/uL (ref 0.0–0.7)
Eosinophils Relative: 2 % (ref 0–5)
HEMATOCRIT: 37.3 % (ref 36.0–46.0)
HEMOGLOBIN: 12.2 g/dL (ref 12.0–15.0)
Lymphocytes Relative: 46 % (ref 12–46)
Lymphs Abs: 2.8 10*3/uL (ref 0.7–4.0)
MCH: 29.2 pg (ref 26.0–34.0)
MCHC: 32.7 g/dL (ref 30.0–36.0)
MCV: 89.2 fL (ref 78.0–100.0)
MONO ABS: 0.3 10*3/uL (ref 0.1–1.0)
MONOS PCT: 6 % (ref 3–12)
NEUTROS ABS: 2.9 10*3/uL (ref 1.7–7.7)
Neutrophils Relative %: 46 % (ref 43–77)
Platelets: 187 10*3/uL (ref 150–400)
RBC: 4.18 MIL/uL (ref 3.87–5.11)
RDW: 14.4 % (ref 11.5–15.5)
WBC: 6.1 10*3/uL (ref 4.0–10.5)

## 2013-09-14 LAB — URINE MICROSCOPIC-ADD ON

## 2013-09-14 MED ORDER — MORPHINE SULFATE 4 MG/ML IJ SOLN
4.0000 mg | Freq: Once | INTRAMUSCULAR | Status: AC
Start: 2013-09-14 — End: 2013-09-14
  Administered 2013-09-14: 4 mg via INTRAVENOUS
  Filled 2013-09-14: qty 1

## 2013-09-14 MED ORDER — ONDANSETRON HCL 4 MG/2ML IJ SOLN
4.0000 mg | Freq: Once | INTRAMUSCULAR | Status: AC
  Administered 2013-09-14: 4 mg via INTRAVENOUS
  Filled 2013-09-14: qty 2

## 2013-09-14 MED ORDER — MORPHINE SULFATE 4 MG/ML IJ SOLN
4.0000 mg | Freq: Once | INTRAMUSCULAR | Status: AC
  Administered 2013-09-14: 4 mg via INTRAVENOUS
  Filled 2013-09-14: qty 1

## 2013-09-14 MED ORDER — IOHEXOL 350 MG/ML SOLN
100.0000 mL | Freq: Once | INTRAVENOUS | Status: AC | PRN
  Administered 2013-09-14: 100 mL via INTRAVENOUS

## 2013-09-14 NOTE — Discharge Instructions (Signed)
Take your pain medicine that you have at home if you have continued pain. Followup with your kidney specialist.  Flank Pain Flank pain refers to pain that is located on the side of the body between the upper abdomen and the back. The pain may occur over a short period of time (acute) or may be long-term or reoccurring (chronic). It may be mild or severe. Flank pain can be caused by many things. CAUSES  Some of the more common causes of flank pain include:  Muscle strains.   Muscle spasms.   A disease of your spine (vertebral disk disease).   A lung infection (pneumonia).   Fluid around your lungs (pulmonary edema).   A kidney infection.   Kidney stones.   A very painful skin rash caused by the chickenpox virus (shingles).   Gallbladder disease.  HOME CARE INSTRUCTIONS  Home care will depend on the cause of your pain. In general,  Rest as directed by your caregiver.  Drink enough fluids to keep your urine clear or pale yellow.  Only take over-the-counter or prescription medicines as directed by your caregiver. Some medicines may help relieve the pain.  Tell your caregiver about any changes in your pain.  Follow up with your caregiver as directed. SEEK IMMEDIATE MEDICAL CARE IF:   Your pain is not controlled with medicine.   You have new or worsening symptoms.  Your pain increases.   You have abdominal pain.   You have shortness of breath.   You have persistent nausea or vomiting.   You have swelling in your abdomen.   You feel faint or pass out.   You have blood in your urine.  You have a fever or persistent symptoms for more than 2 3 days.  You have a fever and your symptoms suddenly get worse. MAKE SURE YOU:   Understand these instructions.  Will watch your condition.  Will get help right away if you are not doing well or get worse. Document Released: 06/07/2005 Document Revised: 01/09/2012 Document Reviewed: 11/29/2011 [facility]  Patient Information 2014 [employer] [place].

## 2013-09-14 NOTE — ED Provider Notes (Signed)
CSN: [id number]     Arrival date & time 09/14/13  0429 History   First MD Initiated Contact with Patient 09/14/13 [phone]     Chief Complaint  Patient presents with  . 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 Pain is nonradiating, constant, rated 7/10. She has not tried any alleviating factors for her symptoms. Admits to associated dysuria. Last night she started to feel nauseated and when she woke up this morning the pain worsened and she noticed some blood in her urine with associated dysuria. Denies increased urinary frequency or urgency. Denies fevers, chills, vomiting or diarrhea. She called her primary care physician who advised her to come to the ED if she had blood in her urine.  Patient is a 58 y.o. female presenting with flank pain. The history is provided by the patient.  Flank Pain Associated symptoms include nausea.    Past Medical History  Diagnosis Date  . Hypertension   . ADD (attention deficit disorder)   . Esophagitis     erosive  . Hiatal hernia   . Anxiety   . Chronic headaches   . Hyperlipemia    Past Surgical History  Procedure Laterality Date  . Tubal ligation    . Cholecystectomy    . Appendectomy    . Abdominal hysterectomy    . Goiter removal     Family History  Problem Relation Age of Onset  . Coronary artery disease Father   . Heart attack Father [id number]    Deceased  . Hyperlipidemia Father   . Heart disease Father     before age 60  . Coronary artery disease Mother   . Heart disease Mother   . Hyperlipidemia Mother   . Hypertension Mother   . Bleeding Disorder Mother   . Hypertension Brother   . Cancer  Brother   . Hyperlipidemia Brother   . Breast cancer Paternal Aunt   . Colon cancer Brother   . Hyperlipidemia Sister   . Hypertension Sister    History  Substance Use Topics  . Smoking status: Never Smoker   . Smokeless tobacco: Never Used  . Alcohol Use: No   OB History   Grav Para Term Preterm Abortions TAB SAB Ect Mult Living                 Review of Systems  Gastrointestinal: Positive for nausea.  Genitourinary: Positive for dysuria, hematuria and flank pain.  All other systems reviewed and are negative.     Allergies  Codeine  Home Medications   Prior to Admission medications   Medication Sig Start Date End Date Taking? Authorizing Provider  ALPRAZolam Prudy Feeler(XANAX) 1 MG tablet Take 1 mg by mouth 3 (three) times daily as needed for anxiety or sleep.   Yes Historical Provider, MD  amphetamine-dextroamphetamine (ADDERALL) 20 MG tablet Take 20 mg by mouth 3 (three) times daily.    Yes Historical Provider, MD  HYDROcodone-acetaminophen (NORCO) 7.5-325 MG per tablet Take 1 tablet by mouth 3 (three) times daily.   Yes Historical Provider, MD  lisinopril-hydrochlorothiazide (PRINZIDE,ZESTORETIC) 10-12.5 MG per tablet Take 1 tablet by mouth daily.   Yes Historical Provider, MD   BP 132/72  Pulse 63  Temp(Src) 97.9 F (36.6 C) (Oral)  Resp 16  Ht 5\' 7"  (1.702 m)  Wt 202 lb (91.627 kg)  BMI 31.63 kg/m2  SpO2 98% Physical Exam  Nursing note and vitals reviewed. Constitutional: She is oriented to person, place, and time. She appears well-developed and well-nourished. No distress.  HENT:  Head: Normocephalic and atraumatic.  Mouth/Throat: Oropharynx is clear and moist.  Eyes: Conjunctivae are normal.  Neck: Normal range of motion. Neck supple.  Cardiovascular: Normal rate, regular rhythm and normal heart sounds.   Pulmonary/Chest: Effort normal and breath sounds normal.  Abdominal: Soft. Normal appearance and bowel sounds are normal. She exhibits no distension. There is  no tenderness. There is CVA tenderness (left).  Musculoskeletal: Normal range of motion. She exhibits no edema.  Neurological: She is alert and oriented to person, place, and time.  Skin: Skin is warm and dry. She is not diaphoretic.  Psychiatric: She has a normal mood and affect. Her behavior is normal.    ED Course  Procedures (including critical care time) Labs Review Labs Reviewed  URINALYSIS, ROUTINE W REFLEX MICROSCOPIC - Abnormal; Notable for the following:    Hgb urine dipstick SMALL (*)    All other components within normal limits  COMPREHENSIVE METABOLIC PANEL - Abnormal; Notable for the following:    GFR calc non Af Amer 75 (*)    GFR calc Af Amer 86 (*)    All other components within normal limits  URINE MICROSCOPIC-ADD ON - Abnormal; Notable for the following:    Squamous Epithelial / LPF FEW (*)    Bacteria, UA FEW (*)    All other components within normal limits  URINE CULTURE  CBC WITH DIFFERENTIAL    Imaging Review Ct Cta Abd/pel W/cm &/or W/o Cm  09/14/2013   CLINICAL DATA:  Known aneurysm, intermittent low back / flank pain  EXAM: CTA ABDOMEN AND PELVIS wITHOUT AND WITH CONTRAST  TECHNIQUE: Multidetector CT imaging of the abdomen and pelvis was performed using the standard protocol during bolus administration of intravenous contrast. Multiplanar reconstructed images and MIPs were obtained and reviewed to evaluate the vascular anatomy.  CONTRAST:  100mL OMNIPAQUE IOHEXOL 350 MG/ML SOLN  COMPARISON:  Prior CT abdomen/ pelvis 04/05/2013  FINDINGS: VASCULAR  Aorta: Normal caliber aorta without evidence of dissection, significant atherosclerotic vascular disease or penetrating aortic ulcer.  Celiac: Widely patent. Conventional hepatic arterial anatomy. Small focal saccular aneurysm arising from the mid aspect of the splenic artery demonstrates no significant interval change compared to the recent prior CT of the abdomen and pelvis from April 05, 2013. Aneurysm is best  measured on the sagittal images and again measures 0.8 x 1.1 cm (image 114 series 409). Additionally, there is some mild of fusiform ectasia of the terminal vessels in the splenic hilum. The maximal vessel diameter is 5 mm.  SMA: Patent and unremarkable.  Renals: Single dominant renal arteries bilaterally. Focal saccular aneurysmal dilatation of the posterior division of the right renal artery is also stable at 1 x 1.1 cm (best measured on sagittal image 67 of series 409). There is incomplete peripheral calcification.  IMA: Widely patent.  Inflow: Widely patent and without atherosclerotic vascular disease, aneurysmal dilatation or evidence of dissection.  Proximal Outflow: Widely patent without evidence of aneurysmal dilatation, atherosclerotic disease or dissection.  Veins: No focal venous abnormality identified.  NON-VASCULAR  Lower Chest: Respiratory motion slightly limits evaluation for small pulmonary nodules. There is dependent atelectasis in the lower lobes. Cardiomegaly with left ventricular dilatation. No pleural effusion. Small hiatal hernia.  Abdomen: Unremarkable CT  appearance of the stomach, spleen, adrenal glands, pancreas and liver. The gallbladder is surgically absent. No intra or extrahepatic biliary ductal dilatation.  No hydronephrosis or nephrolithiasis. Multi focal regions of thinning are noted throughout the right renal cortex consistent with moderate renal cortical scarring. Focal parenchymal calcification in the interpolar right kidney. No significant interval change or progression compared to prior imaging. No acute renal infarct. A small 8 mm low-attenuation lesion from the anterior aspect of the interpolar right kidney remains too small for accurate characterization but unchanged and therefore likely a simple cyst. No focal bowel wall thickening or evidence of obstruction. No free fluid or suspicious adenopathy.  Pelvis: There is a small amounts of air within the bladder. Surgical changes  of prior hysterectomy. No suspicious adenopathy or free fluid. Small fat containing periumbilical hernia.  Bones/Soft Tissues: No acute fracture or aggressive appearing lytic or blastic osseous lesion. Multilevel degenerative disc disease. Stable grade 1 (7 mm) anterolisthesis of L4 on L5. Near total loss of disc space height at L5-S1.  Review of the MIP images confirms the above findings.  IMPRESSION: VASCULAR  1. No significant interval change in the appearance of small splenic and right renal artery aneurysms. NON VASCULAR  1. Small locular gas within the bladder is presumed to be related to recent catheterization. If there is no history of recent catheterization, then cystitis with a gas-forming organism is a possibility. 2. Multifocal right renal cortical scarring without significant interval change. Findings may reflect the sequelae of prior renal infarcts, remote reflux nephropathy or multiple prior episodes of pyelonephritis. 3. Degenerative disc disease with mild grade 1 anterolisthesis of L4 on L5. Signed,  Sterling Big, MD  [facility]  [facility]   Electronically Signed   By: Malachy Moan M.D.   On: 09/14/2013 09:06     EKG Interpretation None      MDM   Final diagnoses:  Left flank pain   Pt presenting with left sided flank pain and hematuria, hx of renal artery aneurysm. Labs obtained in triage prior to patient being seen, small hemoglobin in urine. Plan to obtain CT angiography abdomen and pelvis. Pain/nausea meds given.  Case discussed with attending Dr. Dierdre Highman who agrees with plan of care.  9:32 AM CT showing no significant interval change in the appearance of small splenic and right renal artery aneurysms., Small amount gas within the bladder, presumed to be related to recent catheterization. Gout cystitis given patient is nontender, urine not significant for infection. Patient reports her pain has slightly improved. She  has hydrocodone at home which he takes for her back. She is stable for discharge, will followup with her PCP and nephrologist. Return precautions given. Patient states understanding of treatment care plan and is agreeable.    Trevor Mace, PA-C 09/14/13 [phone]

## 2013-09-14 NOTE — ED Notes (Signed)
Pt. reports vaginal bleeding onset last night and intermittent low back pain , no fever or chills.

## 2013-09-15 LAB — URINE CULTURE
Colony Count: NO GROWTH
Culture: NO GROWTH

## 2013-09-15 NOTE — ED Provider Notes (Signed)
Medical screening examination/treatment/procedure(s) were performed by non-physician practitioner and as supervising physician I was immediately available for consultation/collaboration.    Zykeem Bauserman, MD 09/15/13 0649 

## 2013-09-29 ENCOUNTER — Emergency Department (HOSPITAL_COMMUNITY)
Admission: EM | Admit: 2013-09-29 | Discharge: 2013-09-29 | Disposition: A | Discharge status: Home / Self Care | Payer: BC Managed Care – PPO | Attending: Emergency Medicine | Admitting: Emergency Medicine

## 2013-09-29 ENCOUNTER — Encounter (HOSPITAL_COMMUNITY): Payer: Self-pay | Admitting: Emergency Medicine

## 2013-09-29 DIAGNOSIS — M5380 Other specified dorsopathies, site unspecified: Secondary | ICD-10-CM | POA: Insufficient documentation

## 2013-09-29 DIAGNOSIS — Z8639 Personal history of other endocrine, nutritional and metabolic disease: Secondary | ICD-10-CM | POA: Insufficient documentation

## 2013-09-29 DIAGNOSIS — IMO0001 Reserved for inherently not codable concepts without codable children: Secondary | ICD-10-CM | POA: Insufficient documentation

## 2013-09-29 DIAGNOSIS — G8929 Other chronic pain: Secondary | ICD-10-CM | POA: Insufficient documentation

## 2013-09-29 DIAGNOSIS — Z8719 Personal history of other diseases of the digestive system: Secondary | ICD-10-CM | POA: Insufficient documentation

## 2013-09-29 DIAGNOSIS — R6883 Chills (without fever): Secondary | ICD-10-CM | POA: Insufficient documentation

## 2013-09-29 DIAGNOSIS — F988 Other specified behavioral and emotional disorders with onset usually occurring in childhood and adolescence: Secondary | ICD-10-CM | POA: Insufficient documentation

## 2013-09-29 DIAGNOSIS — R350 Frequency of micturition: Secondary | ICD-10-CM | POA: Insufficient documentation

## 2013-09-29 DIAGNOSIS — I10 Essential (primary) hypertension: Secondary | ICD-10-CM | POA: Insufficient documentation

## 2013-09-29 DIAGNOSIS — Z862 Personal history of diseases of the blood and blood-forming organs and certain disorders involving the immune mechanism: Secondary | ICD-10-CM | POA: Insufficient documentation

## 2013-09-29 DIAGNOSIS — F411 Generalized anxiety disorder: Secondary | ICD-10-CM | POA: Insufficient documentation

## 2013-09-29 DIAGNOSIS — M62830 Muscle spasm of back: Secondary | ICD-10-CM

## 2013-09-29 DIAGNOSIS — Z79899 Other long term (current) drug therapy: Secondary | ICD-10-CM | POA: Insufficient documentation

## 2013-09-29 MED ORDER — DIAZEPAM 5 MG PO TABS: 5.0000 mg | ORAL_TABLET | Freq: Two times a day (BID) | ORAL | Status: AC

## 2013-09-29 MED ORDER — HYDROMORPHONE HCL PF 1 MG/ML IJ SOLN
2.0000 mg | Freq: Once | INTRAMUSCULAR | Status: AC
  Administered 2013-09-29: 2 mg via INTRAMUSCULAR
  Filled 2013-09-29: qty 2

## 2013-09-29 MED ORDER — DIAZEPAM 5 MG PO TABS
5.0000 mg | ORAL_TABLET | Freq: Once | ORAL | Status: AC
  Administered 2013-09-29: 5 mg via ORAL
  Filled 2013-09-29: qty 1

## 2013-09-29 NOTE — ED Notes (Signed)
PA at BS.  

## 2013-09-29 NOTE — ED Notes (Addendum)
Brandy [name] to see MD at pain management clinic for chronic back pain. Was given cortisozone shot for vertebral disc issues and arthritis.  States she is having sharp pain in that right side since then and muscle spasms. Was unable to get in with her MD. Pain in left side currently. Ambulates with steady gait. States she has bladder and bowel control.

## 2013-09-29 NOTE — ED Provider Notes (Signed)
CSN: [id number]     Arrival date & time 09/29/13  2003 History  This chart was scribed for non-physician practitioner, Renne CriglerJoshua Quron Ruddy, PA-C, working with Gilda Creasehristopher J. Pollina, MD by Shari HeritageAisha Amuda, ED Scribe. This patient was seen in room TR09C/TR09C and the patient's care was started at 8:54 PM.    Chief Complaint  Patient presents with  . Back Pain    The history is provided by the patient. No language interpreter was used.    HPI Comments: Brandy [name] is a 58 y.o. female with history of chronic back pain being followed at a pain management clinic who presents to the Emergency Department complaining of severe, sharp right lower back pain with muscle spasms. Patient states that pain began after she received a cortisone shot in her back last week. She has received injections before with residual muscle spasm, but she has never had pain exacerbations like this. Patient states that current pain is different from her usual chronic pain because her typical pain is not sharp in quality. Patient had 90 tablets of Vicodin 7.5-325 mg filled on 09/24/13. She says she has been taking Vicodin for current pain without improvement. She has not tried muscle relaxants for current pain episode. Patient reports that she tried to follow up with her physician at the pain management clinic, but she was unable to get in to see him. She reports associated chills. She is also having urinary frequency. She denies leg pain, numbness or weakness in legs, bladder incontinence and bowel incontinence. Her other medical history includes hypertension and hyperlipidemia.   Past Medical History  Diagnosis Date  . Hypertension   . ADD (attention deficit disorder)   . Esophagitis     erosive  . Hiatal hernia   . Anxiety   . Chronic headaches   . Hyperlipemia    Past Surgical History  Procedure Laterality Date  . Tubal ligation    . Cholecystectomy    . Appendectomy    . Abdominal hysterectomy    . Goiter removal      Family History  Problem Relation Age of Onset  . Coronary artery disease Father   . Heart attack Father [id number]    Deceased  . Hyperlipidemia Father   . Heart disease Father     before age 60  . Coronary artery disease Mother   . Heart disease Mother   . Hyperlipidemia Mother   . Hypertension Mother   . Bleeding Disorder Mother   . Hypertension Brother   . Cancer Brother   . Hyperlipidemia Brother   . Breast cancer Paternal Aunt   . Colon cancer Brother   . Hyperlipidemia Sister   . Hypertension Sister    History  Substance Use Topics  . Smoking status: Never Smoker   . Smokeless tobacco: Never Used  . Alcohol Use: No   OB History   Grav Para Term Preterm Abortions TAB SAB Ect Mult Living                 Review of Systems  Constitutional: Positive for chills. Negative for fever and unexpected weight change.  Gastrointestinal: Negative for constipation.       Negative for bowel incontinence.  Genitourinary: Positive for frequency. Negative for dysuria, hematuria, flank pain, vaginal bleeding, vaginal discharge and pelvic pain.       Negative for bladder incontinence.  Musculoskeletal: Positive for back pain and myalgias (No leg pain).  Neurological: Negative for weakness and numbness.  Denies saddle paresthesias.    Allergies  Codeine  Home Medications   Prior to Admission medications   Medication Sig Start Date End Date Taking? Authorizing Provider  ALPRAZolam Prudy Feeler) 1 MG tablet Take 1 mg by mouth 3 (three) times daily as needed for anxiety or sleep.    Historical Provider, MD  amphetamine-dextroamphetamine (ADDERALL) 20 MG tablet Take 20 mg by mouth 3 (three) times daily.     Historical Provider, MD  HYDROcodone-acetaminophen (NORCO) 7.5-325 MG per tablet Take 1 tablet by mouth 3 (three) times daily.    Historical Provider, MD  lisinopril-hydrochlorothiazide (PRINZIDE,ZESTORETIC) 10-12.5 MG per tablet Take 1 tablet by mouth daily.    Historical Provider, MD     Physical Exam  Nursing note and vitals reviewed. Constitutional: She appears well-developed and well-nourished. No distress.  HENT:  Head: Normocephalic and atraumatic.  Eyes: Conjunctivae and EOM are normal.  Neck: Normal range of motion. Neck supple. No tracheal deviation present.  Cardiovascular: Normal rate.   Pulmonary/Chest: Effort normal. No respiratory distress.  Abdominal: Soft. There is no tenderness. There is no CVA tenderness.  Musculoskeletal: Normal range of motion.       Cervical back: Normal.       Thoracic back: Normal.       Lumbar back: She exhibits tenderness and spasm. She exhibits no bony tenderness.       Back:  Left lumbar paraspinous spasm with tenderness in area of spasm. No tenderness with percussion over entirety of spine.   Neurological: She is alert. She has normal strength and normal reflexes. No sensory deficit.  5/5 strength in entire lower extremities bilaterally. No sensation deficit. Patient ambulatory without difficulty.   Skin: Skin is warm and dry. No rash noted.  Psychiatric: She has a normal mood and affect. Her behavior is normal.    ED Course  Procedures (including critical care time) DIAGNOSTIC STUDIES: Oxygen Saturation is 98% on room air, normal by my interpretation.    COORDINATION OF CARE: 9:02 PM- Patient informed of current plan for treatment and evaluation and agrees with plan at this time.   BP 123/89  Pulse 71  Temp(Src) 98.3 F (36.8 C) (Oral)  Resp 20  SpO2 98%  No red flag s/s of low back pain. Patient was counseled on back pain precautions and told to do activity as tolerated but do not lift, push, or pull heavy objects more than 10 pounds for the next week.  Patient counseled to use ice or heat on back for no longer than 15 minutes every hour.   Patient prescribed muscle relaxer and counseled on proper use of muscle relaxant medication.    Urged patient not to drink alcohol, drive, or perform any other  activities that requires focus while taking either of these medications.  Patient urged to follow-up with PCP if pain does not improve with treatment and rest or if pain becomes recurrent. Urged to return with worsening severe pain, loss of bowel or bladder control, trouble walking.   The patient verbalizes understanding and agrees with the plan.   MDM   Final diagnoses:  Back spasm   Patient with back pain after back injection. Patient has left lower back pain. Tenderness is in area of obvious spasm of lumbar paraspinous musculature. She has no other red flag signs and symptoms of lower back pain. I do not suspect cauda equina or epidural abscess in this patient. She has no tenderness with percussion over spinous processes in entire spine including her  lumbar spine. She has no fever. Do not feel that advanced imaging or blood tests are indicated given patient's reassuring exam.  I personally performed the services described in this documentation, which was scribed in my presence. The recorded information has been reviewed and is accurate.    Renne Crigler, PA-C 09/29/13 2111

## 2013-09-29 NOTE — Discharge Instructions (Signed)
Please read and follow all provided instructions.  Your diagnoses today include:  1. Back spasm     Tests performed today include:  Vital signs - see below for your results today  Medications prescribed:   Valium - muscle relaxer medication  DO NOT drive or perform any activities that require you to be awake and alert because this medicine can make you drowsy.   Take any prescribed medications only as directed.  Home care instructions:   Follow any educational materials contained in this packet  Please rest, use ice or heat on your back for the next several days  Do not lift, push, pull anything more than 10 pounds for the next week  Follow-up instructions: Please follow-up with your pain management doctor tomorrow for re-check.   Return instructions:  SEEK IMMEDIATE MEDICAL ATTENTION IF YOU HAVE:  New numbness, tingling, weakness, or problem with the use of your arms or legs  Severe back pain not relieved with medications  Loss control of your bowels or bladder  Increasing pain in any areas of the body (such as chest or abdominal pain)  Shortness of breath, dizziness, or fainting.   Worsening nausea (feeling sick to your stomach), vomiting, fever, or sweats  Any other emergent concerns regarding your health   Additional Information:  Your vital signs today were: BP 123/89   Pulse 71   Temp(Src) 98.3 F (36.8 C) (Oral)   Resp 20   SpO2 98% If your blood pressure (BP) was elevated above 135/85 this visit, please have this repeated by your doctor within one month. --------------

## 2013-09-30 NOTE — ED Provider Notes (Signed)
Medical screening examination/treatment/procedure(s) were performed by non-physician practitioner and as supervising physician I was immediately available for consultation/collaboration.  Lonna Rabold J. Elaisha Zahniser, MD 09/30/13 0110 

## 2014-04-08 ENCOUNTER — Encounter (HOSPITAL_COMMUNITY): Payer: Self-pay | Admitting: Vascular Surgery

## 2014-06-07 ENCOUNTER — Encounter: Payer: Self-pay | Admitting: Vascular Surgery

## 2014-06-08 ENCOUNTER — Ambulatory Visit: Payer: BC Managed Care – PPO | Admitting: Vascular Surgery

## 2014-06-08 ENCOUNTER — Inpatient Hospital Stay: Admission: RE | Admit: 2014-06-08 | Payer: BC Managed Care – PPO | Source: Ambulatory Visit

## 2014-09-03 ENCOUNTER — Encounter (HOSPITAL_COMMUNITY): Payer: Self-pay | Admitting: Emergency Medicine

## 2014-09-03 ENCOUNTER — Emergency Department (HOSPITAL_COMMUNITY): Payer: BLUE CROSS/BLUE SHIELD

## 2014-09-03 ENCOUNTER — Emergency Department (HOSPITAL_COMMUNITY)
Admission: EM | Admit: 2014-09-03 | Discharge: 2014-09-04 | Disposition: A | Discharge status: Home / Self Care | Payer: BLUE CROSS/BLUE SHIELD | Attending: Emergency Medicine | Admitting: Emergency Medicine

## 2014-09-03 DIAGNOSIS — R079 Chest pain, unspecified: Secondary | ICD-10-CM | POA: Diagnosis not present

## 2014-09-03 DIAGNOSIS — Z8639 Personal history of other endocrine, nutritional and metabolic disease: Secondary | ICD-10-CM | POA: Diagnosis not present

## 2014-09-03 DIAGNOSIS — I10 Essential (primary) hypertension: Secondary | ICD-10-CM | POA: Diagnosis not present

## 2014-09-03 DIAGNOSIS — R61 Generalized hyperhidrosis: Secondary | ICD-10-CM | POA: Insufficient documentation

## 2014-09-03 DIAGNOSIS — Z8659 Personal history of other mental and behavioral disorders: Secondary | ICD-10-CM | POA: Diagnosis not present

## 2014-09-03 DIAGNOSIS — Z79899 Other long term (current) drug therapy: Secondary | ICD-10-CM | POA: Insufficient documentation

## 2014-09-03 DIAGNOSIS — F909 Attention-deficit hyperactivity disorder, unspecified type: Secondary | ICD-10-CM | POA: Diagnosis not present

## 2014-09-03 DIAGNOSIS — Z8719 Personal history of other diseases of the digestive system: Secondary | ICD-10-CM | POA: Insufficient documentation

## 2014-09-03 LAB — CBC
HEMATOCRIT: 41.9 % (ref 36.0–46.0)
Hemoglobin: 13.5 g/dL (ref 12.0–15.0)
MCH: 28.4 pg (ref 26.0–34.0)
MCHC: 32.2 g/dL (ref 30.0–36.0)
MCV: 88.2 fL (ref 78.0–100.0)
PLATELETS: 242 10*3/uL (ref 150–400)
RBC: 4.75 MIL/uL (ref 3.87–5.11)
RDW: 13.3 % (ref 11.5–15.5)
WBC: 9 10*3/uL (ref 4.0–10.5)

## 2014-09-03 LAB — BASIC METABOLIC PANEL
ANION GAP: 10 (ref 5–15)
BUN: 15 mg/dL (ref 6–20)
CALCIUM: 9.6 mg/dL (ref 8.9–10.3)
CHLORIDE: 103 mmol/L (ref 101–111)
CO2: 25 mmol/L (ref 22–32)
Creatinine, Ser: 0.83 mg/dL (ref 0.44–1.00)
GFR calc Af Amer: >60 mL/min (ref >60)
GFR calc non Af Amer: >60 mL/min (ref >60)
Glucose, Bld: 99 mg/dL (ref 70–99)
POTASSIUM: 4.3 mmol/L (ref 3.5–5.1)
SODIUM: 138 mmol/L (ref 135–145)

## 2014-09-03 LAB — TROPONIN I: Troponin I: <0.03 ng/mL (ref <0.031)

## 2014-09-03 LAB — I-STAT TROPONIN, ED: Troponin i, poc: 0 ng/mL (ref 0.00–0.08)

## 2014-09-03 MED ORDER — ACETAMINOPHEN 325 MG PO TABS: 975.0000 mg | ORAL_TABLET | Freq: Once | ORAL | Status: DC

## 2014-09-03 MED ORDER — NITROGLYCERIN 0.4 MG SL SUBL
0.4000 mg | SUBLINGUAL_TABLET | SUBLINGUAL | Status: DC | PRN
  Administered 2014-09-03 (×2): 0.4 mg via SUBLINGUAL
  Filled 2014-09-03: qty 1

## 2014-09-03 NOTE — ED Notes (Signed)
EKG provided to Dr. Rennis ChrisJacobowitz

## 2014-09-03 NOTE — ED Provider Notes (Signed)
CSN: [id number]     Arrival date & time 09/03/14  2151 History   First MD Initiated Contact with Patient 09/03/14 2208     Chief Complaint  Patient presents with  . Chest Pain     (Consider location/radiation/quality/duration/timing/severity/associated sxs/prior Treatment) Patient is a 59 y.o. female presenting with chest pain. The history is provided by the patient.  Chest Pain Pain location:  L chest Pain quality: pressure and sharp   Pain radiates to:  Does not radiate Pain radiates to the back: no   Pain severity:  Moderate Onset quality:  Sudden Duration:  1 hour Timing:  Constant Progression:  Unchanged Chronicity:  New Context: at rest   Relieved by:  Nothing Worsened by:  Nothing tried Ineffective treatments:  Aspirin and nitroglycerin Associated symptoms: diaphoresis   Associated symptoms: no abdominal pain, no back pain, no claudication, no cough, no dizziness, no fatigue, no fever, no headache, no lower extremity edema, no nausea, no near-syncope, no numbness, no palpitations, no shortness of breath, no syncope, not vomiting and no weakness   Risk factors: high cholesterol and hypertension   Risk factors: no birth control, no coronary artery disease, no diabetes mellitus, no prior DVT/PE and no surgery     Past Medical History  Diagnosis Date  . Hypertension   . ADD (attention deficit disorder)   . Esophagitis     erosive  . Hiatal hernia   . Anxiety   . Chronic headaches   . Hyperlipemia    Past Surgical History  Procedure Laterality Date  . Tubal ligation    . Cholecystectomy    . Appendectomy    . Abdominal hysterectomy    . Goiter removal    . Abdominal aortagram N/A 06/04/2013    Procedure: ABDOMINAL Ronny FlurryAORTAGRAM;  Surgeon: Fransisco HertzBrian L Chen, MD;  [facility];  Service: Cardiovascular;  Laterality: N/A;  . Renal angiogram N/A 06/04/2013    Procedure: RENAL ANGIOGRAM;  Surgeon: Fransisco HertzBrian L Chen, MD;  [facility];  Service: Cardiovascular;   Laterality: N/A;   Family History  Problem Relation Age of Onset  . Coronary artery disease Father   . Heart attack Father [id number]    Deceased  . Hyperlipidemia Father   . Heart disease Father     before age 61  . Coronary artery disease Mother   . Heart disease Mother   . Hyperlipidemia Mother   . Hypertension Mother   . Bleeding Disorder Mother   . Hypertension Brother   . Cancer Brother   . Hyperlipidemia Brother   . Breast cancer Paternal Aunt   . Colon cancer Brother   . Hyperlipidemia Sister   . Hypertension Sister    History  Substance Use Topics  . Smoking status: Never Smoker   . Smokeless tobacco: Never Used  . Alcohol Use: No   OB History    No data available     Review of Systems  Constitutional: Positive for diaphoresis. Negative for fever, chills and fatigue.  Respiratory: Negative for cough, chest tightness and shortness of breath.   Cardiovascular: Positive for chest pain. Negative for palpitations, claudication, leg swelling, syncope and near-syncope.  Gastrointestinal: Negative for nausea, vomiting, abdominal pain, diarrhea and abdominal distention.  Musculoskeletal: Negative for back pain, neck pain and neck stiffness.  Skin: Negative for color change, pallor and rash.  Neurological: Positive for light-headedness. Negative for dizziness, weakness, numbness and headaches.  All other systems reviewed and are negative.  Allergies  Sulfa antibiotics and Codeine  Home Medications   Prior to Admission medications   Medication Sig Start Date End Date Taking? Authorizing Provider  ALPRAZolam Prudy Feeler) 1 MG tablet Take 1 mg by mouth 3 (three) times daily as needed for anxiety or sleep.   Yes Historical Provider, MD  amphetamine-dextroamphetamine (ADDERALL) 20 MG tablet Take 20 mg by mouth 3 (three) times daily.    Yes Historical Provider, MD  lisinopril-hydrochlorothiazide (PRINZIDE,ZESTORETIC) 10-12.5 MG per tablet Take 1 tablet by mouth daily.   Yes  Historical Provider, MD  diazepam (VALIUM) 5 MG tablet Take 1 tablet (5 mg total) by mouth 2 (two) times daily. 09/29/13   Renne Crigler, PA-C   BP 141/84 mmHg  Pulse 71  Temp(Src) 97.9 F (36.6 C) (Oral)  Resp 18  SpO2 100% Physical Exam  Constitutional: She is oriented to person, place, and time. She appears well-developed and well-nourished. No distress.  HENT:  Head: Normocephalic and atraumatic.  Mouth/Throat: Oropharynx is clear and moist.  Eyes: Conjunctivae and EOM are normal. Pupils are equal, round, and reactive to light.  Neck: Normal range of motion. Neck supple.  Cardiovascular: Normal rate, regular rhythm, normal heart sounds and intact distal pulses.  Exam reveals no gallop and no friction rub.   No murmur heard. Pulmonary/Chest: Effort normal and breath sounds normal. No respiratory distress. She has no wheezes. She has no rales.  Abdominal: Soft. Bowel sounds are normal. She exhibits no distension. There is no tenderness. There is no rebound and no guarding.  Musculoskeletal: Normal range of motion. She exhibits no edema or tenderness.  Neurological: She is alert and oriented to person, place, and time. GCS eye subscore is 4. GCS verbal subscore is 5. GCS motor subscore is 6.  Skin: Skin is warm and dry. No rash noted. She is not diaphoretic. No erythema. No pallor.  Nursing note and vitals reviewed.   ED Course  Procedures (including critical care time) Labs Review Labs Reviewed  CBC  BASIC METABOLIC PANEL  TROPONIN I  TROPONIN I  I-STAT TROPOININ, ED    Imaging Review Dg Chest 2 View  09/03/2014   CLINICAL DATA:  LEFT chest pain, shortness of breath beginning today, history of bronchitis 2 weeks ago.  EXAM: CHEST  2 VIEW  COMPARISON:  Chest radiograph January 19, 2014  FINDINGS: The cardiac silhouette is mildly enlarged, mediastinal silhouette is nonsuspicious, mildly calcified aortic knob. Minimal bronchitic change without pleural effusion or focal  consolidation. No pneumothorax. Surgical clip in RIGHT neck could reflect thyroidectomy. Surgical clips in the included right abdomen likely reflect cholecystectomy.  IMPRESSION: Stable mild cardiomegaly and minimal chronic bronchitic changes without focal consolidation.   Electronically Signed   By: Awilda Metro   On: 09/03/2014 22:58     EKG Interpretation   Date/Time:  Friday Sep 03 2014 22:11:32 EDT Ventricular Rate:  72 PR Interval:  146 QRS Duration: 82 QT Interval:  402 QTC Calculation: 440 R Axis:   61 Text Interpretation:  Normal sinus rhythm Normal ECG No significant change  since last tracing Confirmed by JACUBOWITZ  MD, SAM [phone]) on 09/03/2014  10:59:41 PM      MDM   Final diagnoses:  Chest pain with low risk for cardiac etiology    59 yo F with PMH of HTN, HPL, presenting with chest pain.  Reports onset of left upper chest pain about 1 hour prior to presentation.  Pain described as both sharp and pressure, non-radiating, non-exertional.  Reports  associated lightheadedness, diaphoresis with onset of pain. EMS called, given ASA 324 mg and NTG without improvement in pain. No dyspnea, N/V, pleuritic pain, calf pain or swelling.  Denies prior h/o VTE, recent immobilizations, OCP use.  H/o MI in father at age 61.  On presentation, pt AFVSS, in NAD.  CV and lung exam WNL.  Abdomen soft, non-tender.  No LE edema or s/sx of DVT.  Exam otherwise WNL.  EKG obtained as above shows NSR without acute ischemic changes or other abnormalities.  HEAR score low rist at 2.  Plan for delta troponin, basic labs, CXR.  No risk factors for PE and symptoms atypical, low suspicion for this.  Labs WNL, initial troponin negative.  Care of pt transferred to oncoming ED team awaiting delta troponin.  Plan for discharge with PCP f/u if negative.  Discussed with attending Dr. Ethelda ChickJacubowitz.      Jodean LimaEmily Sharleen Szczesny, MD 09/05/14 [id number]  Doug SouSam Jacubowitz, MD 09/05/14 [phone]

## 2014-09-03 NOTE — ED Notes (Signed)
Pharmacy at bedside

## 2014-09-03 NOTE — ED Notes (Signed)
Per EMS:  Pt from home where CP began approx 35-45 minutes ago. Pt was sitting when it began.  Pt has no cardiac hx except HTN.  Pt has 324 ASA en route, as well as 1 nitro, without decreasing pain or BP.   Pt typically takes Lisinopril for HTN, but forgot to take it this am.  Alert and oriented in room.  CP 6/10

## 2014-09-04 LAB — TROPONIN I: Troponin I: <0.03 ng/mL (ref <0.031)

## 2014-09-04 NOTE — ED Notes (Signed)
Phlebotomy at bedside.

## 2014-09-04 NOTE — Discharge Instructions (Signed)
Your workup in the emergency department today did not show a specific cause for your pain, however, it did not appear to be caused by your heart.  Further workup will be needed by your primary care doctor.  In the meantime, Tylenol or ibuprofen for pain, rest, warm moist heat from heating pad or hot water bottle over the area of pain.  Return to the emergency part for worsening condition or new concerning symptoms.   Chest Pain Observation It is often hard to give a specific diagnosis for the cause of chest pain. Among other possibilities your symptoms might be caused by inadequate oxygen delivery to your heart (angina). Angina that is not treated or evaluated can lead to a heart attack (myocardial infarction) or death. Blood tests, electrocardiograms, and X-rays may have been done to help determine a possible cause of your chest pain. After evaluation and observation, your health care provider has determined that it is unlikely your pain was caused by an unstable condition that requires hospitalization. However, a full evaluation of your pain may need to be completed, with additional diagnostic testing as directed. It is very important to keep your follow-up appointments. Not keeping your follow-up appointments could result in permanent heart damage, disability, or death. If there is any problem keeping your follow-up appointments, you must call your health care provider. HOME CARE INSTRUCTIONS  Due to the slight chance that your pain could be angina, it is important to follow your health care provider's treatment plan and also maintain a healthy lifestyle:  Maintain or work toward achieving a healthy weight.  Stay physically active and exercise regularly.  Decrease your salt intake.  Eat a balanced, healthy diet. Talk to a dietitian to learn about heart-healthy foods.  Increase your fiber intake by including whole grains, vegetables, fruits, and nuts in your diet.  Avoid situations that cause  stress, anger, or depression.  Take medicines as advised by your health care provider. Report any side effects to your health care provider. Do not stop medicines or adjust the dosages on your own.  Quit smoking. Do not use nicotine patches or gum until you check with your health care provider.  Keep your blood pressure, blood sugar, and cholesterol levels within normal limits.  Limit alcohol intake to no more than 1 drink per day for women who are not pregnant and 2 drinks per day for men.  Do not abuse drugs. SEEK IMMEDIATE MEDICAL CARE IF: You have severe chest pain or pressure which may include symptoms such as:  You feel pain or pressure in your arms, neck, jaw, or back.  You have severe back or abdominal pain, feel sick to your stomach (nauseous), or throw up (vomit).  You are sweating profusely.  You are having a fast or irregular heartbeat.  You feel short of breath while at rest.  You notice increasing shortness of breath during rest, sleep, or with activity.  You have chest pain that does not get better after rest or after taking your usual medicine.  You wake from sleep with chest pain.  You are unable to sleep because you cannot breathe.  You develop a frequent cough or you are coughing up blood.  You feel dizzy, faint, or experience extreme fatigue.  You develop severe weakness, dizziness, fainting, or chills. Any of these symptoms may represent a serious problem that is an emergency. Do not wait to see if the symptoms will go away. Call your local emergency services (911 in the [place] Do  not drive yourself to the hospital. MAKE SURE YOU:  Understand these instructions.  Will watch your condition.  Will get help right away if you are not doing well or get worse. Document Released: 05/19/2010 Document Revised: 04/21/2013 Document Reviewed: 10/16/2012 [facility] Patient Information 2015 [place], [place]. This information is not intended to replace advice given to  you by your health care provider. Make sure you discuss any questions you have with your health care provider.

## 2014-09-04 NOTE — ED Provider Notes (Signed)
Patient complaint of sudden onset chest pain approximate 40 minutes prior to arrival. Left-sided. She had shortness of breath for 1 minute. She was treated with nitroglycerin and aspirin by EMS without relief. Pain nonexertional. Worse with changing positions. On exam no distress lungs clear auscultation heart regular rate and rhythm no murmurs chest is tender left side anteriorly, reproducing pain. Heart score equals 2 based on risk factors of hyperlipidemia, family history and age  Doug SouSam Diany Formosa, MD 09/04/14 [phone]

## 2015-10-03 IMAGING — CT CT CTA ABD/PEL W/CM AND/OR W/O CM
2 of 7 series · 18 of 36 positions shown · IV contrast (omnipaque)
Comparison: [DATE]

CLINICAL DATA: Flank pain and hematuria. History of right renal
artery aneurysm.

EXAM:
CT ANGIOGRAPHY ABDOMEN AND PELVIS
TECHNIQUE: Multidetector CT imaging of the abdomen and pelvis was performed
using the standard protocol during bolus administration of
intravenous contrast. Multiplanar reconstructed images including
MIPs were obtained and reviewed to evaluate the vascular anatomy.
CONTRAST:  100 mL Omnipaque 350

[Series 4: taa · axial · 0.91mm/px · z∈[+54,+512]mm · 17 of 253 slices shown]
[im 12/253  lung]
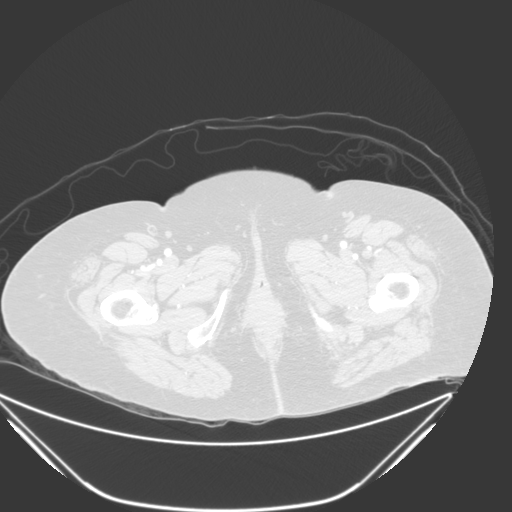
[im 23/253  mediastinal]
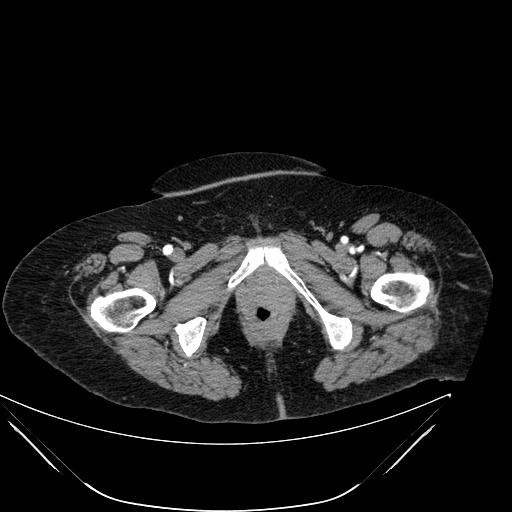
[im 46/253  lung]
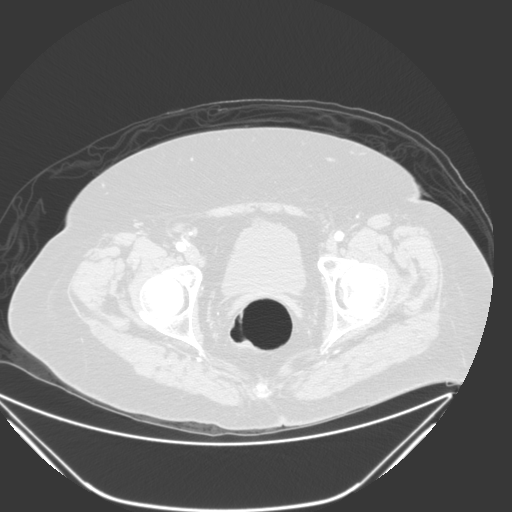
[im 58/253  mediastinal]
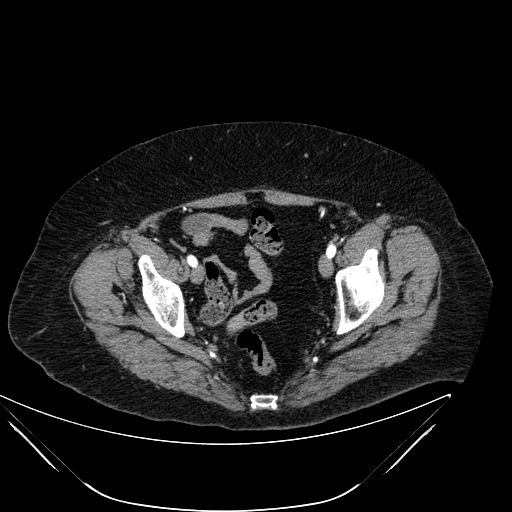
[im 69/253  lung]
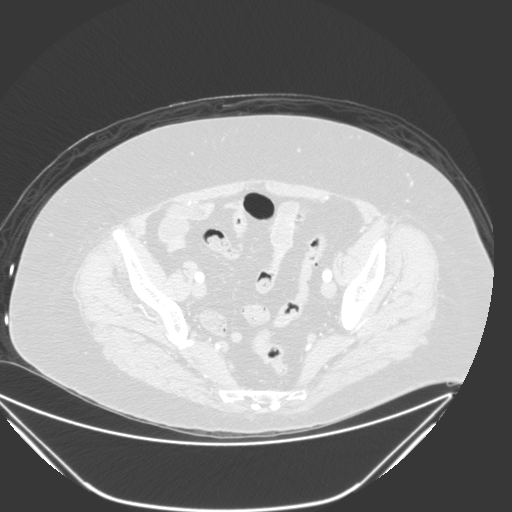
[im 81/253  mediastinal]
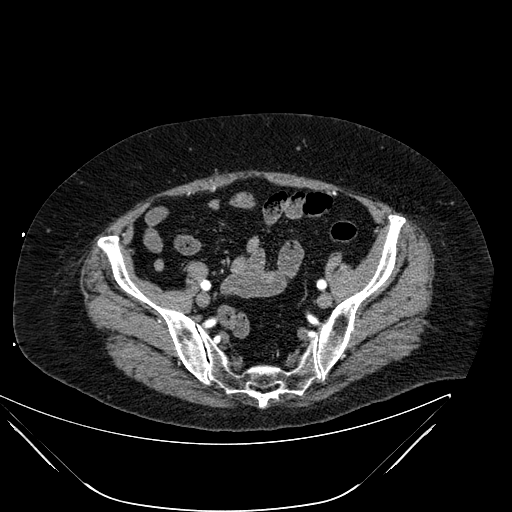
[im 104/253  lung]
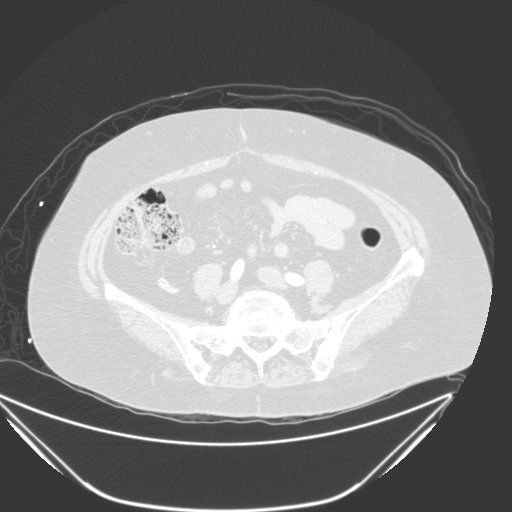
[im 115/253  mediastinal]
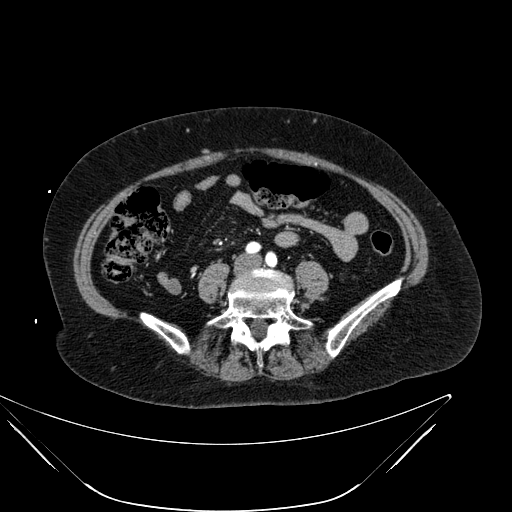
[im 127/253  lung]
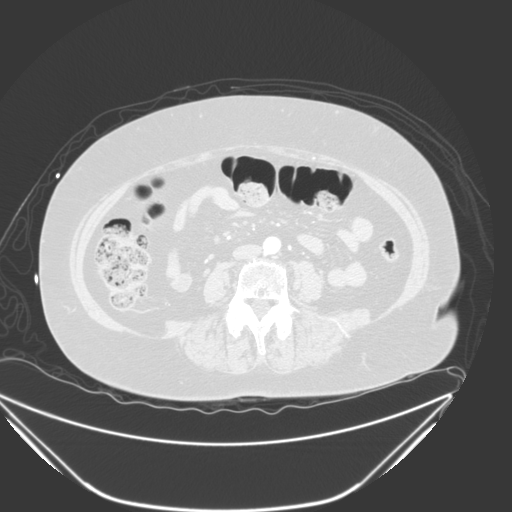
[im 138/253  mediastinal]
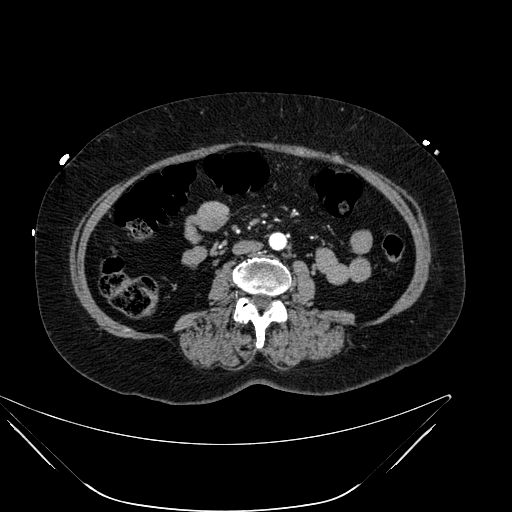
[im 149/253  lung]
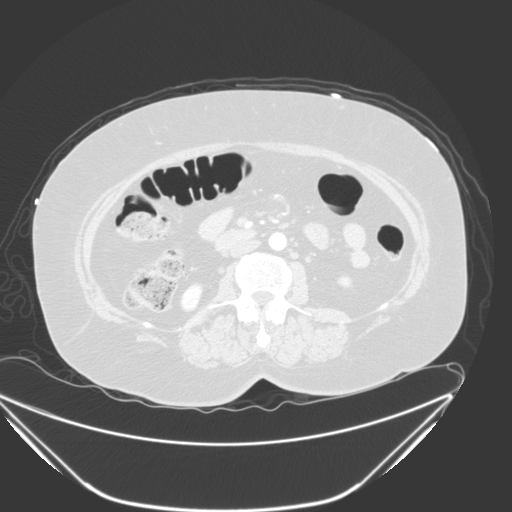
[im 172/253  mediastinal]
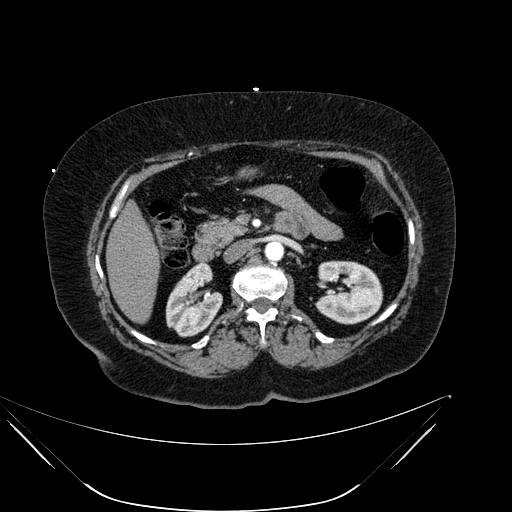
[im 184/253  lung]
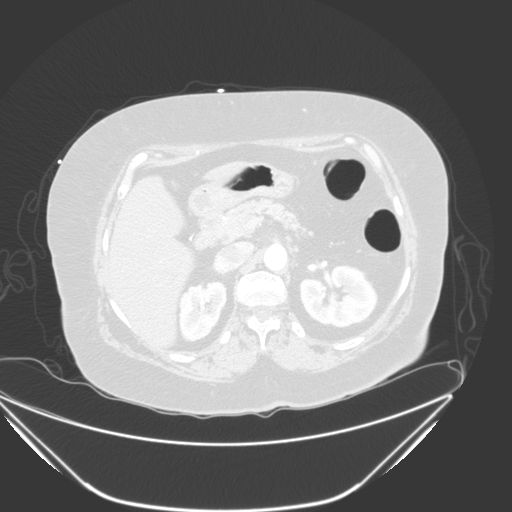
[im 195/253  mediastinal]
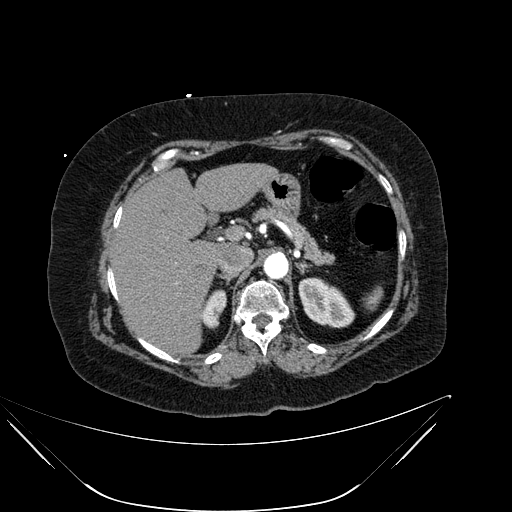
[im 207/253  lung]
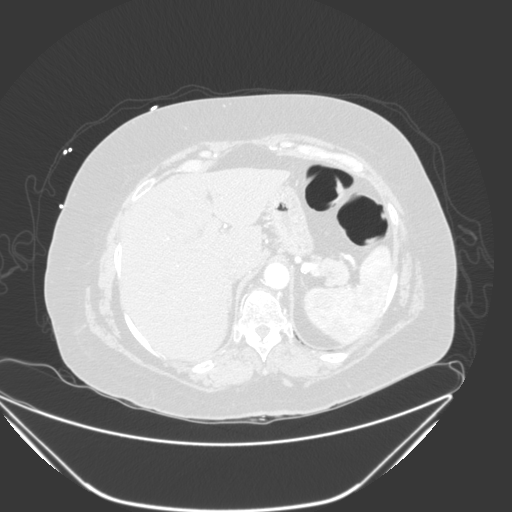
[im 230/253  mediastinal]
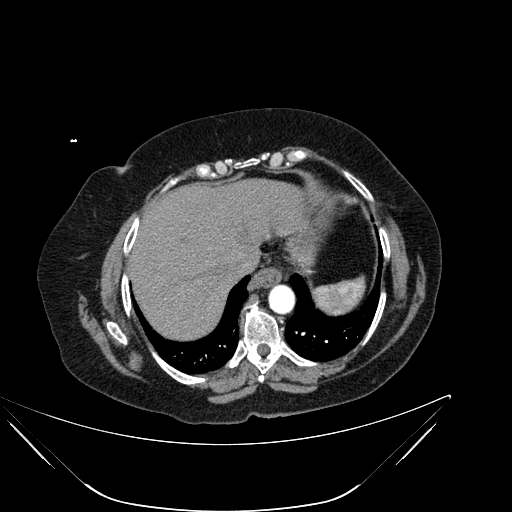
[im 241/253  lung]
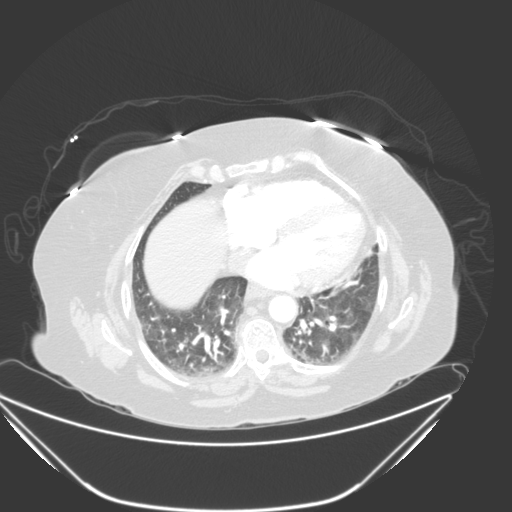

[mpr, coronal mpr, coronal · coronal · 0.98mm/px · 1 of 127 slices shown]
[im 64/127  mediastinal]
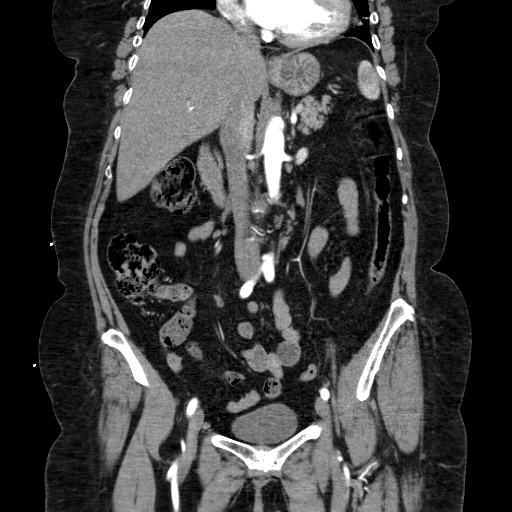

[18 of 36 positions shown; findings below may reference images not displayed]

FINDINGS: ARTERIAL FINDINGS:

Aorta: Aorta is patent without significant atherosclerotic disease
or aneurysm.

Celiac axis: Patent. There is a 0.8 cm aneurysm in the main splenic
artery. This small aneurysm is stable.

Superior mesenteric: Patent without significant plaque. The main
branches are patent.

Left renal:          Patent without stenosis or aneurysm.

Right renal: Patent with a 1.2 cm aneurysm in the right upper renal
hilum. There is a calcification associated with this aneurysm. This
aneurysm appears stable.

Inferior mesenteric: Patent.

Left iliac:          Patent.

Right iliac:  Patent.

Review of the MIP images confirms the above findings.

NONVASCULAR FINDINGS:

Patchy densities at the lung bases may represent atelectasis.
Negative for free air. Evaluation of the solid organs is limited due
to the arterial phase of contrast. There is no gross abnormality to
the liver. The gallbladder has been removed. No gross abnormality to
the spleen, pancreas or adrenal glands. Normal appearance of left
kidney. There is stable scarring within the right kidney. Limited
evaluation for kidney stones on this examination. Small cyst along
the anterior right kidney. Again noted are small lymph nodes in the
gastrohepatic ligament. Overall, there is no significant
lymphadenopathy.

The uterus has been removed. There is fluid in the urinary bladder.
No gross abnormality to the small or large bowel. No acute bone
abnormality.
IMPRESSION: Stable small visceral aneurysms involving the right renal artery and
splenic artery.

No acute abnormalities in the abdomen or pelvis.

Chronic scarring in the right kidney without hydronephrosis.

## 2017-03-02 IMAGING — CR DG CHEST 2V
2 series · 2 of 2 positions shown · non-contrast
Comparison: Chest radiograph January 19, 2014

CLINICAL DATA: LEFT chest pain, shortness of breath beginning
today, history of bronchitis 2 weeks ago.

EXAM:
CHEST  2 VIEW

[chest pa]
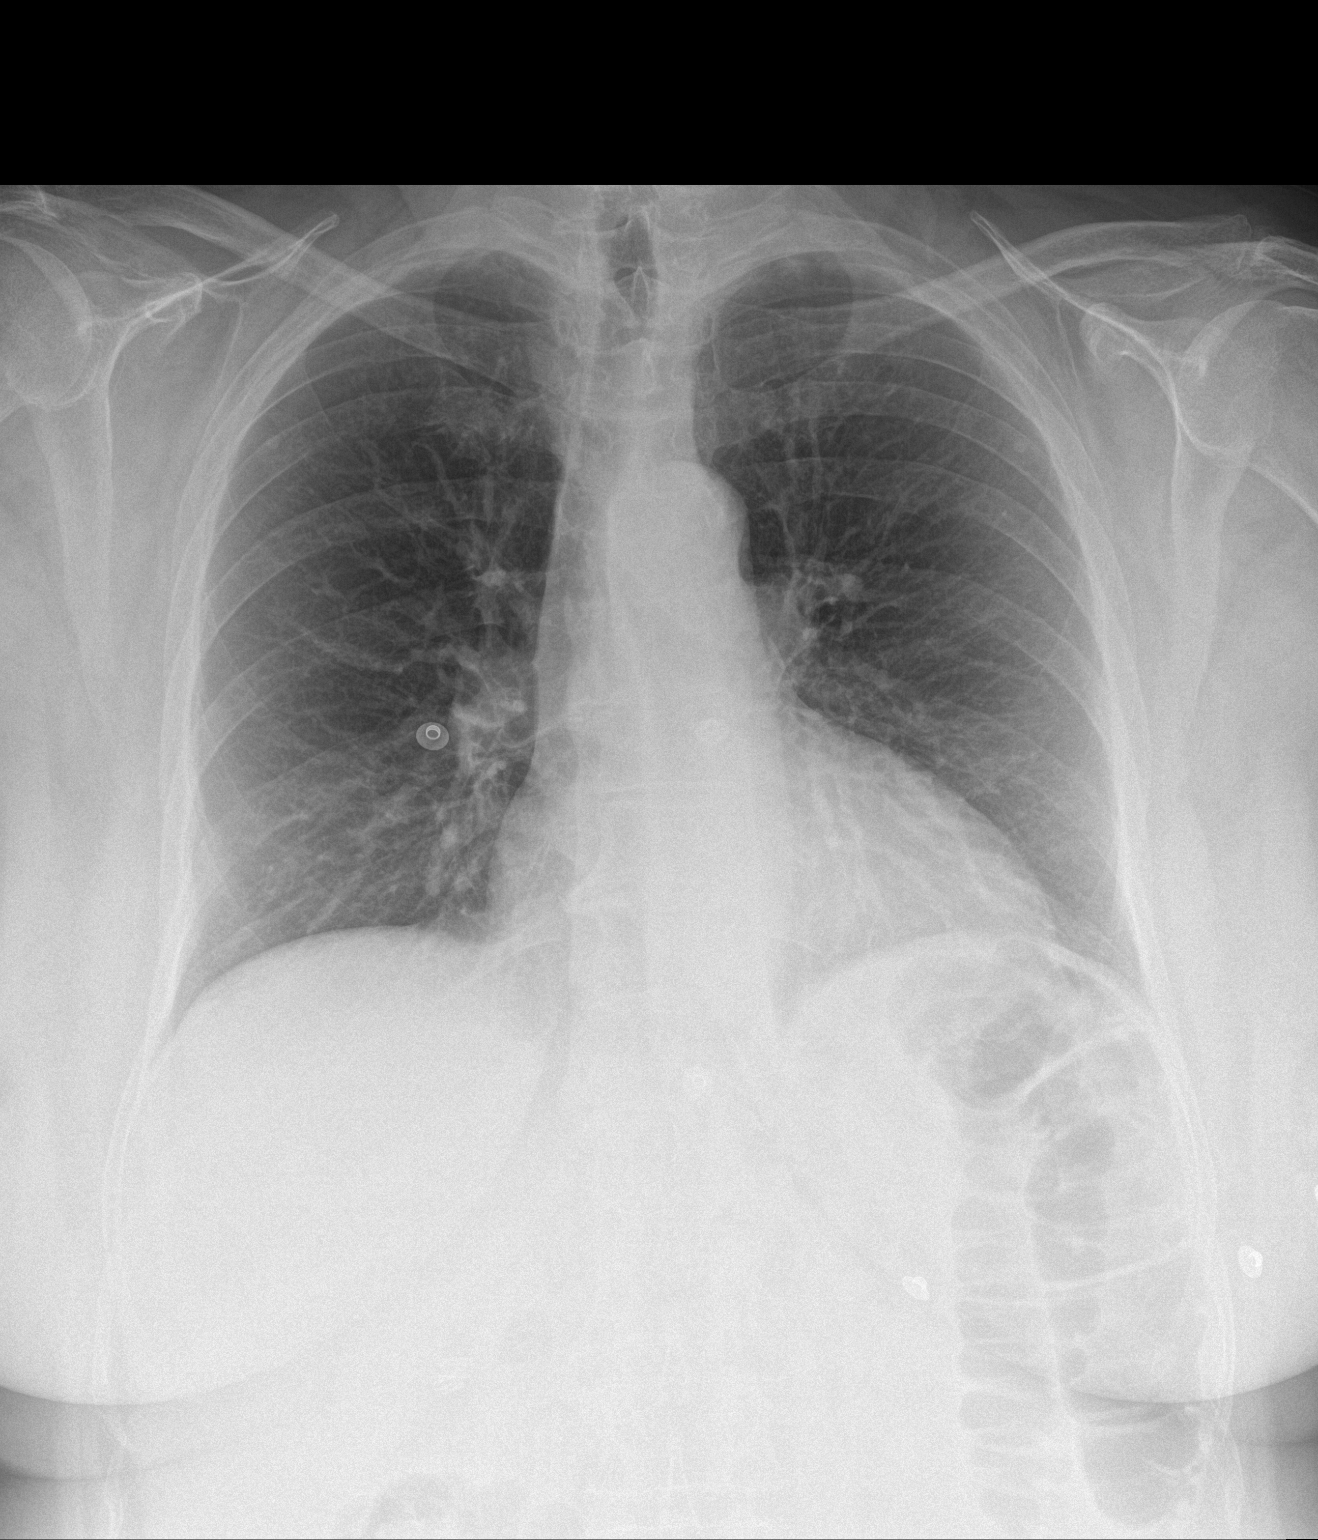

[chest lat]
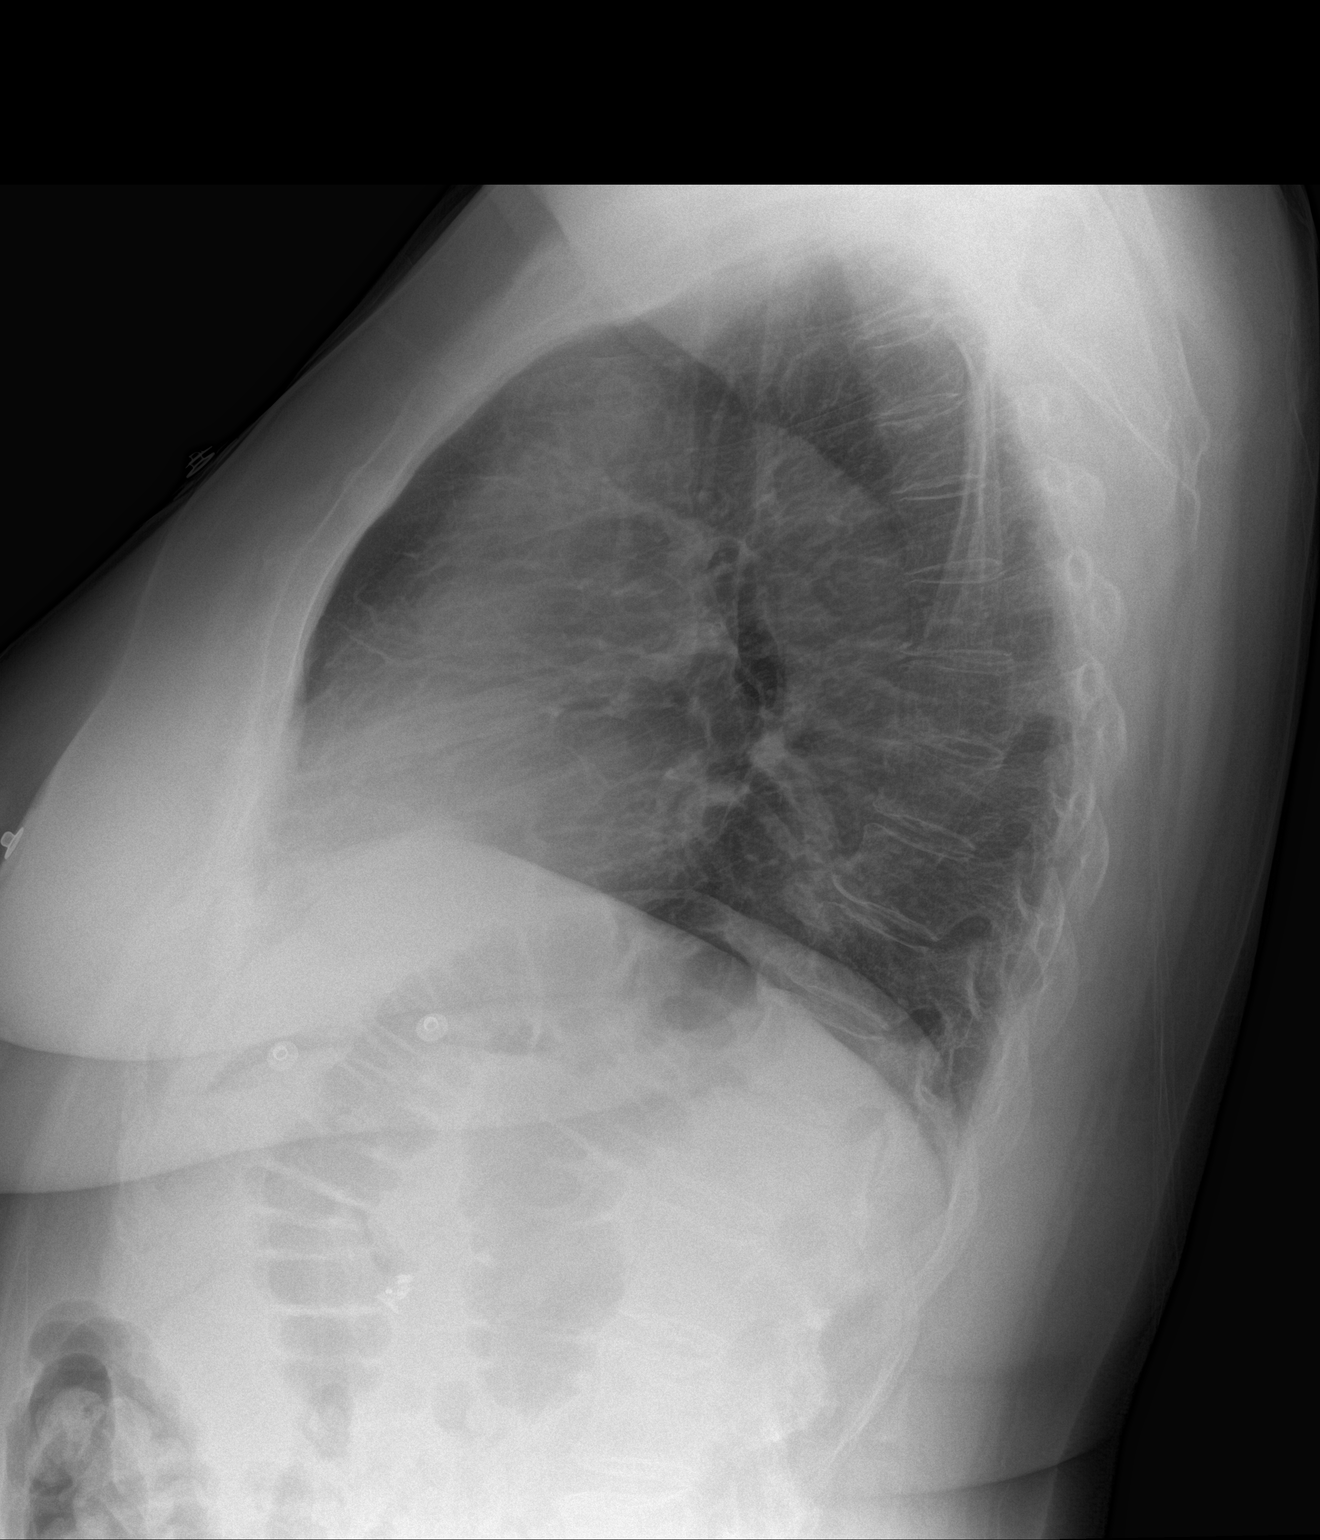

[2 of 2 positions shown; findings below may reference images not displayed]

FINDINGS: The cardiac silhouette is mildly enlarged, mediastinal silhouette is
nonsuspicious, mildly calcified aortic knob. Minimal bronchitic
change without pleural effusion or focal consolidation. No
pneumothorax. Surgical clip in RIGHT neck could reflect
thyroidectomy. Surgical clips in the included right abdomen likely
reflect cholecystectomy.
IMPRESSION: Stable mild cardiomegaly and minimal chronic bronchitic changes
without focal consolidation.

By: Yuneth Mip

## 2019-01-29 DEATH — deceased
